# Patient Record
Sex: Female | Born: 1935 | Race: White | Hispanic: No | Smoking: Never smoker
Health system: Southern US, Community
[De-identification: ages and names within clinical notes are randomized; demographics above are authoritative.]

## PROBLEM LIST (undated history)

## (undated) DIAGNOSIS — E079 Disorder of thyroid, unspecified: Secondary | ICD-10-CM

## (undated) DIAGNOSIS — F329 Major depressive disorder, single episode, unspecified: Secondary | ICD-10-CM

## (undated) DIAGNOSIS — I639 Cerebral infarction, unspecified: Secondary | ICD-10-CM

## (undated) DIAGNOSIS — G459 Transient cerebral ischemic attack, unspecified: Secondary | ICD-10-CM

## (undated) DIAGNOSIS — H409 Unspecified glaucoma: Secondary | ICD-10-CM

## (undated) DIAGNOSIS — E785 Hyperlipidemia, unspecified: Secondary | ICD-10-CM

## (undated) DIAGNOSIS — F32A Depression, unspecified: Secondary | ICD-10-CM

## (undated) DIAGNOSIS — I10 Essential (primary) hypertension: Secondary | ICD-10-CM

## (undated) DIAGNOSIS — K6289 Other specified diseases of anus and rectum: Secondary | ICD-10-CM

## (undated) DIAGNOSIS — J449 Chronic obstructive pulmonary disease, unspecified: Secondary | ICD-10-CM

## (undated) DIAGNOSIS — F0390 Unspecified dementia without behavioral disturbance: Secondary | ICD-10-CM

## (undated) DIAGNOSIS — J45909 Unspecified asthma, uncomplicated: Secondary | ICD-10-CM

## (undated) DIAGNOSIS — A499 Bacterial infection, unspecified: Secondary | ICD-10-CM

## (undated) DIAGNOSIS — H6120 Impacted cerumen, unspecified ear: Secondary | ICD-10-CM

## (undated) DIAGNOSIS — M1990 Unspecified osteoarthritis, unspecified site: Secondary | ICD-10-CM

## (undated) DIAGNOSIS — R296 Repeated falls: Secondary | ICD-10-CM

## (undated) DIAGNOSIS — N390 Urinary tract infection, site not specified: Secondary | ICD-10-CM

## (undated) DIAGNOSIS — M8580 Other specified disorders of bone density and structure, unspecified site: Secondary | ICD-10-CM

## (undated) DIAGNOSIS — K219 Gastro-esophageal reflux disease without esophagitis: Secondary | ICD-10-CM

## (undated) DIAGNOSIS — Z66 Do not resuscitate: Secondary | ICD-10-CM

## (undated) HISTORY — PX: KNEE SURGERY: SHX244

## (undated) HISTORY — PX: APPENDECTOMY: SHX54

## (undated) HISTORY — PX: BLADDER SURGERY: SHX569

## (undated) HISTORY — PX: NM ESOPHAGEAL REFLUX: HXRAD613

## (undated) HISTORY — PX: ABDOMINAL HYSTERECTOMY: SHX81

## (undated) HISTORY — PX: BACK SURGERY: SHX140

---

## 1998-03-15 ENCOUNTER — Other Ambulatory Visit: Admission: RE | Admit: 1998-03-15 | Discharge: 1998-03-15 | Payer: Self-pay | Admitting: *Deleted

## 1999-04-22 ENCOUNTER — Encounter: Admission: RE | Admit: 1999-04-22 | Discharge: 1999-04-22 | Payer: Self-pay | Admitting: *Deleted

## 2005-04-18 ENCOUNTER — Encounter: Admission: RE | Admit: 2005-04-18 | Discharge: 2005-04-18 | Payer: Self-pay | Admitting: Internal Medicine

## 2008-03-02 ENCOUNTER — Inpatient Hospital Stay (HOSPITAL_COMMUNITY): Admission: RE | Admit: 2008-03-02 | Discharge: 2008-03-04 | Payer: Self-pay | Admitting: Neurosurgery

## 2010-05-23 LAB — BASIC METABOLIC PANEL
BUN: 26 mg/dL — ABNORMAL HIGH (ref 6–23)
CO2: 30 mEq/L (ref 19–32)
Calcium: 10.7 mg/dL — ABNORMAL HIGH (ref 8.4–10.5)
Chloride: 101 mEq/L (ref 96–112)
Creatinine, Ser: 1.03 mg/dL (ref 0.4–1.2)
GFR calc Af Amer: >60 mL/min (ref >60)
GFR calc non Af Amer: 51 mL/min — ABNORMAL LOW (ref >60)
Glucose, Bld: 104 mg/dL — ABNORMAL HIGH (ref 70–99)
Potassium: 4.6 mEq/L (ref 3.5–5.1)
Sodium: 139 mEq/L (ref 135–145)

## 2010-05-23 LAB — TYPE AND SCREEN
ABO/RH(D): A POS
Antibody Screen: NEGATIVE

## 2010-05-23 LAB — CBC
MCHC: 32.7 g/dL (ref 30.0–36.0)
Platelets: 310 10*3/uL (ref 150–400)
RBC: 4.39 MIL/uL (ref 3.87–5.11)
RDW: 15.9 % — ABNORMAL HIGH (ref 11.5–15.5)

## 2010-06-21 NOTE — Discharge Summary (Signed)
Lynn Fischer, Lynn Fischer             ACCOUNT NO.:  [id number]   MEDICAL RECORD NO.:  [id number]          PATIENT TYPE:  INP   LOCATION:  3007                         FACILITY:  [facility]   PHYSICIAN:  Reinaldo Meeker, M.D. DATE OF BIRTH:  1918-08-13   DATE OF ADMISSION:  03/02/2008  DATE OF DISCHARGE:  03/04/2008                               DISCHARGE SUMMARY   PRIMARY DIAGNOSIS:  Spondylolisthesis and stenosis with synovial cyst at  L4-5, left.   PRIMARY OPERATIVE PROCEDURE:  Left L4-5 transforaminal lumbar interbody  fusion with instrumentation.   HISTORY:  [name] is an 77 year old female with back and left lower  extremity pain.  She tried conservative without improvement.  An MRI was  done which showed grade 1 spondylolisthesis with stenosis and synovial  cyst at L4-5 on the left and she was brought to the hospital at this  time for surgical intervention.  On 01/25, the patient was taken to  operating room where she underwent the above-mentioned fusion.  She  tolerated the procedure well with complete relief of her leg pain.  Over  the next two days, she was able to advance her diet and activity without  difficulty.  By 01/27, she was up ambulating well.  Her wound looked  good.  She had no leg pain at this time and it was felt that she could  be discharged home.  Discharge medications included her routine  preoperative medications along with Dilaudid for pain.  Her condition  was markedly improved versus admission.           ______________________________  Reinaldo Meeker, M.D.     ROK/MEDQ  D:  03/04/2008  T:  03/04/2008  Job:  [id number]

## 2010-06-21 NOTE — Op Note (Signed)
NAMENITHILA, SUMNERS             ACCOUNT NO.:  [id number]   MEDICAL RECORD NO.:  [id number]          PATIENT TYPE:  INP   LOCATION:  3007                         FACILITY:  [facility]   PHYSICIAN:  Reinaldo Meeker, M.D. DATE OF BIRTH:  1918-09-10   DATE OF PROCEDURE:  03/02/2008  DATE OF DISCHARGE:                               OPERATIVE REPORT   PREOPERATIVE DIAGNOSIS:  Spondylolisthesis and stenosis with synovial  cyst L4-5 left.   POSTOPERATIVE DIAGNOSIS:  Spondylolisthesis and stenosis with synovial  cyst L4-5 left.   PROCEDURE:  Left L4-5 transverse lumbar interbody fusion followed with a  PEEK interbody cage, followed by L4-5 nonsegmental instrumentation with  right L4-5 transfacet screw and spinous process plate, followed by L4-5  posterolateral fusion.   SURGEON:  Reinaldo Meeker, MD   ASSISTANT:  Kathaleen Maser. Pool, MD   PROCEDURE IN DETAIL:  After being placed in the prone position, the  patient's back was prepped and draped in the usual sterile fashion.  Localizing fluoroscopy was used prior to incision to identify the  appropriate level.  Midline incision was made above the spinous process  of L4-L5.  Using Bovie cutting current, the incision was carried down  the spinous processes.  A subperiosteal dissection was then carried out  bilaterally on the spinous processes of the lamina facet joint.  Self-  retaining retractor was placed for exposure.  X-rays showed approach to  the appropriate level.  On the patient's left side, generous laminotomy  was performed by removing the inferior three-quarters of the L4 lamina,  medial three-quarters of facet joint, and the superior one-third of the  L5 lamina.  Residual bone ligamentum flavum removed in a piecemeal  fashion.  L4 and L5 nerve roots were both identified more so than needed  for transverse lumbar interbody fusion.  At this time, a disk at L4-5  was incised and thoroughly cleaned out with pituitary rongeurs and  curettes.  The disk was then prepared for posterior interbody fusion  with a variety of decorticated instruments.  This was distracted up to a  10 mm size and this was felt to be a good fit.  Prior to placing the  PEEK interbody cage filled with Osteocel Plus autologous bone, a mixture  of Osteocel Plus and autologous bone were placed free from the  interspace to help with the interbody fusion.  The cage was then  impacted and then rotated crossways without difficulty and was found to  be in excellent position in AP and lateral fluoroscopy.  At this time, a  transfacet screw was placed on the right at L4-5.  Drill hole entry  point was carried out.  Drill over the guidewire was passed with the  guide through the inferior facet of L4 into the pedicle of L5.  This was  found in AP and lateral fluoroscopy.  The intraoperative EMG monitoring  also confirmed no evidence of breech of the pedicle.  Tapping was then  carried out prior to placing of 35 mm screw which was found to be in  excellent position on AP and lateral fluoroscopy.  Interspinous ligament  was then removed and a 45-mm interspinous plate was chosen.  Once the  spinous processes were prepared, the plate was compressed under the  spinous process of L4-L5 without difficulty and fluoroscopy showed to be  in excellent position.  Large amounts of irrigation were carried out.  Any bleeding controlled on the left side with bipolar coagulation and  Gelfoam.  High-speed drill was used to decorticate the lamina facet  joint on the patient's right side at L4-5 and a mixture of autologous  bone and Osteocel Plus were placed.  Hemovac drain was then left in the  epidural space and brought through a separate stab wound incision.  Wound was then closed in multiple layers of Vicryl in the muscle fascia,  subcutaneous and subcuticular tissues and staples were placed on the  skin.  Sterile dressing was then applied.  The patient was extubated and   taken to recovery room in stable condition.           ______________________________  Reinaldo Meeker, M.D.     ROK/MEDQ  D:  03/02/2008  T:  03/03/2008  Job:  [id number]

## 2011-11-02 ENCOUNTER — Encounter (HOSPITAL_BASED_OUTPATIENT_CLINIC_OR_DEPARTMENT_OTHER): Payer: Self-pay | Admitting: *Deleted

## 2011-11-02 ENCOUNTER — Other Ambulatory Visit: Payer: Self-pay

## 2011-11-02 ENCOUNTER — Emergency Department (HOSPITAL_BASED_OUTPATIENT_CLINIC_OR_DEPARTMENT_OTHER): Payer: No Typology Code available for payment source

## 2011-11-02 ENCOUNTER — Emergency Department (HOSPITAL_BASED_OUTPATIENT_CLINIC_OR_DEPARTMENT_OTHER)
Admission: EM | Admit: 2011-11-02 | Discharge: 2011-11-02 | Disposition: A | Discharge status: Home / Self Care | Payer: No Typology Code available for payment source | Attending: Emergency Medicine | Admitting: Emergency Medicine

## 2011-11-02 DIAGNOSIS — Y93H2 Activity, gardening and landscaping: Secondary | ICD-10-CM | POA: Insufficient documentation

## 2011-11-02 DIAGNOSIS — Y92009 Unspecified place in unspecified non-institutional (private) residence as the place of occurrence of the external cause: Secondary | ICD-10-CM | POA: Insufficient documentation

## 2011-11-02 DIAGNOSIS — I10 Essential (primary) hypertension: Secondary | ICD-10-CM | POA: Insufficient documentation

## 2011-11-02 DIAGNOSIS — R42 Dizziness and giddiness: Secondary | ICD-10-CM | POA: Insufficient documentation

## 2011-11-02 DIAGNOSIS — W19XXXA Unspecified fall, initial encounter: Secondary | ICD-10-CM | POA: Insufficient documentation

## 2011-11-02 DIAGNOSIS — S0990XA Unspecified injury of head, initial encounter: Secondary | ICD-10-CM | POA: Insufficient documentation

## 2011-11-02 HISTORY — DX: Essential (primary) hypertension: I10

## 2011-11-02 HISTORY — DX: Unspecified asthma, uncomplicated: J45.909

## 2011-11-02 LAB — TROPONIN I: Troponin I: <0.3 ng/mL (ref <0.30)

## 2011-11-02 LAB — CBC
MCH: 29.7 pg (ref 26.0–34.0)
MCHC: 33.2 g/dL (ref 30.0–36.0)
MCV: 89.6 fL (ref 78.0–100.0)
Platelets: 265 10*3/uL (ref 150–400)
RDW: 14.3 % (ref 11.5–15.5)
WBC: 7.3 10*3/uL (ref 4.0–10.5)

## 2011-11-02 LAB — BASIC METABOLIC PANEL
Calcium: 10.9 mg/dL — ABNORMAL HIGH (ref 8.4–10.5)
Creatinine, Ser: 0.9 mg/dL (ref 0.50–1.10)
GFR calc Af Amer: 63 mL/min — ABNORMAL LOW (ref >90)
GFR calc non Af Amer: 54 mL/min — ABNORMAL LOW (ref >90)

## 2011-11-02 MED ORDER — SODIUM CHLORIDE 0.9 % IV BOLUS (SEPSIS): 500.0000 mL | Freq: Once | INTRAVENOUS | Status: DC

## 2011-11-02 NOTE — ED Provider Notes (Signed)
History     CSN: [id number]  Arrival date & time 11/02/11  1226   First MD Initiated Contact with Patient 11/02/11 1226      Chief Complaint  Patient presents with  . Fall  . Head Injury  . Dizziness    (Consider location/radiation/quality/duration/timing/severity/associated sxs/prior treatment) HPI Lynn Fischer is a 78 y.o. female history of hypertension asthma presenting with a fall. She is awake alert and oriented x4. She says she was out on her porch watering her plants, when she bent over she got a little bit dizzy and fell backwards. She remembers the entire event, hit her head denies loss of consciousness. Initially she refused transport but later agreed. At this point she denies any headache, neck pain, chest pain, shortness of breath, nausea vomiting, fevers or chills.  A focal neurologic complaints. No traumatic complaints. She has been otherwise in good health. She walks with a walker, she has 2 walkers in the house to use her various locations. In fact the patient had walked down the street to clinic to check on appointment early this morning and felt just fine. She says occasionally she does get dizzy when she bends over.  Past Medical History  Diagnosis Date  . Hypertension   . Asthma     History reviewed. No pertinent past surgical history.  History reviewed. No pertinent family history.  History  Substance Use Topics  . Smoking status: Never Smoker   . Smokeless tobacco: Not on file  . Alcohol Use: No    OB History    Grav Para Term Preterm Abortions TAB SAB Ect Mult Living                  Review of Systems At least 10pt or greater review of systems completed and are negative except where specified in the HPI.  Allergies  Review of patient's allergies indicates no known allergies.  Home Medications  No current outpatient prescriptions on file.  There were no vitals taken for this visit.  Physical Exam Nursing notes reviewed.  Electronic  medical record reviewed. VITAL SIGNS:   Filed Vitals:   11/02/11 1311 11/02/11 1312 11/02/11 1313 11/02/11 1533  BP: 169/92 169/103 176/98 161/81  Pulse: 93 102 99 91  Temp:    98.4 F (36.9 C)  TempSrc:    Oral  Resp:    18  SpO2:    97%   CONSTITUTIONAL: Awake, oriented, appears non-toxic HENT: Right parietal scalp hematoma, no laceration, normocephalic, oral mucosa pink and moist, airway patent. Nares patent without drainage. External ears normal. EYES: Conjunctiva clear, EOMI, PERRLA NECK: Trachea midline, non-tender, supple CARDIOVASCULAR: Normal heart rate, Normal rhythm, No murmurs, rubs, gallops PULMONARY/CHEST: Clear to auscultation, no rhonchi, wheezes, or rales. Symmetrical breath sounds. Non-tender. ABDOMINAL: Non-distended, obese, soft, non-tender - no rebound or guarding.  BS normal. NEUROLOGIC: Non-focal, moving all four extremities, no gross sensory or motor deficits. EXTREMITIES: No clubbing, cyanosis, or edema SKIN: Warm, Dry, No erythema, No rash  ED Course  Procedures (including critical care time)  Labs Reviewed  BASIC METABOLIC PANEL - Abnormal; Notable for the following:    BUN 31 (*)     Calcium 10.9 (*)     GFR calc non Af Amer 54 (*)     GFR calc Af Amer 63 (*)     All other components within normal limits  CBC  TROPONIN I   No results found.   1. Fall     MDM  Lynn Fischer is a 78 y.o. female presenting with fall. He states in the patient's presentation, she's completely normal, at baseline, no neurologic deficits, no complaints of pain, no chest pain. No prodromal events including dizziness chest pain or shortness of breath simply get dizzy when she bent over to water some plants. Patient is laughing at this time and very pleasant to interact with.  We'll perform some screening labs due to the patient's age, EKG, CT of the head and troponin.  Laboratory workup is negative - show some chronic renal insufficiency however she is a GFR of 54  and 91 it's pretty good and seems to be at her, EKG shows normal sinus rhythm without ST or T wave abnormalities suggestive of ischemia or infarction-no arrhythmia or atrial fibrillation, CT of the head shows a right parietal scalp hematoma consistent with physical exam without any acute intracranial hemorrhage or abnormality.  Discussed findings with the patient of my physical exam and workup, she would like to go home.  Patient does have close followup will be seen within the next 2 days by primary care physician she also has assistance with her apartment. Patient also has a walking assist device is at home.  I explained the diagnosis and have given explicit precautions to return to the ER including return of dizziness, persistent dizziness, focal neurologic deficits, chest pain, shortness of breath or any other new or worsening symptoms. The patient understands and accepts the medical plan as it's been dictated and I have answered their questions. Discharge instructions concerning home care and prescriptions have been given.  The patient is STABLE and is discharged to home in good condition.        Jones Skene, MD 11/06/11 1451

## 2011-11-02 NOTE — ED Notes (Signed)
Pt to room 10 by PTAR. Pt is awake and alert, states she was watering her plants this am and became dizzy. ems states pt initially refused transport, but later agreed due to ongoing dizziness. Denies any ha, moe + x 4 ext, grips are = and strong, speech is clear, smile is symmetrical. No ekg performed by ems, ekg performed by tech while pt being triaged.

## 2012-05-13 ENCOUNTER — Emergency Department (HOSPITAL_BASED_OUTPATIENT_CLINIC_OR_DEPARTMENT_OTHER)
Admission: EM | Admit: 2012-05-13 | Discharge: 2012-05-13 | Disposition: A | Discharge status: Other Acute Inpatient Hospital | Payer: No Typology Code available for payment source | Attending: Emergency Medicine | Admitting: Emergency Medicine

## 2012-05-13 ENCOUNTER — Emergency Department (HOSPITAL_BASED_OUTPATIENT_CLINIC_OR_DEPARTMENT_OTHER): Payer: No Typology Code available for payment source

## 2012-05-13 ENCOUNTER — Encounter (HOSPITAL_BASED_OUTPATIENT_CLINIC_OR_DEPARTMENT_OTHER): Payer: Self-pay | Admitting: *Deleted

## 2012-05-13 DIAGNOSIS — Z7982 Long term (current) use of aspirin: Secondary | ICD-10-CM | POA: Insufficient documentation

## 2012-05-13 DIAGNOSIS — R079 Chest pain, unspecified: Secondary | ICD-10-CM | POA: Insufficient documentation

## 2012-05-13 DIAGNOSIS — Z8719 Personal history of other diseases of the digestive system: Secondary | ICD-10-CM | POA: Insufficient documentation

## 2012-05-13 DIAGNOSIS — M1990 Unspecified osteoarthritis, unspecified site: Secondary | ICD-10-CM | POA: Insufficient documentation

## 2012-05-13 DIAGNOSIS — R5381 Other malaise: Secondary | ICD-10-CM | POA: Insufficient documentation

## 2012-05-13 DIAGNOSIS — R05 Cough: Secondary | ICD-10-CM | POA: Insufficient documentation

## 2012-05-13 DIAGNOSIS — Z79899 Other long term (current) drug therapy: Secondary | ICD-10-CM | POA: Insufficient documentation

## 2012-05-13 DIAGNOSIS — J449 Chronic obstructive pulmonary disease, unspecified: Secondary | ICD-10-CM

## 2012-05-13 DIAGNOSIS — J441 Chronic obstructive pulmonary disease with (acute) exacerbation: Secondary | ICD-10-CM | POA: Insufficient documentation

## 2012-05-13 DIAGNOSIS — F329 Major depressive disorder, single episode, unspecified: Secondary | ICD-10-CM | POA: Insufficient documentation

## 2012-05-13 DIAGNOSIS — F3289 Other specified depressive episodes: Secondary | ICD-10-CM | POA: Insufficient documentation

## 2012-05-13 DIAGNOSIS — R112 Nausea with vomiting, unspecified: Secondary | ICD-10-CM | POA: Insufficient documentation

## 2012-05-13 DIAGNOSIS — M899 Disorder of bone, unspecified: Secondary | ICD-10-CM | POA: Insufficient documentation

## 2012-05-13 DIAGNOSIS — J45901 Unspecified asthma with (acute) exacerbation: Secondary | ICD-10-CM | POA: Insufficient documentation

## 2012-05-13 DIAGNOSIS — R059 Cough, unspecified: Secondary | ICD-10-CM | POA: Insufficient documentation

## 2012-05-13 DIAGNOSIS — I10 Essential (primary) hypertension: Secondary | ICD-10-CM | POA: Insufficient documentation

## 2012-05-13 DIAGNOSIS — N390 Urinary tract infection, site not specified: Secondary | ICD-10-CM | POA: Insufficient documentation

## 2012-05-13 HISTORY — DX: Impacted cerumen, unspecified ear: H61.20

## 2012-05-13 HISTORY — DX: Unspecified osteoarthritis, unspecified site: M19.90

## 2012-05-13 HISTORY — DX: Other specified diseases of anus and rectum: K62.89

## 2012-05-13 HISTORY — DX: Major depressive disorder, single episode, unspecified: F32.9

## 2012-05-13 HISTORY — DX: Other specified disorders of bone density and structure, unspecified site: M85.80

## 2012-05-13 HISTORY — DX: Essential (primary) hypertension: I10

## 2012-05-13 HISTORY — DX: Depression, unspecified: F32.A

## 2012-05-13 LAB — CBC WITH DIFFERENTIAL/PLATELET
Basophils Absolute: 0 10*3/uL (ref 0.0–0.1)
Basophils Relative: 0 % (ref 0–1)
Eosinophils Absolute: 0.1 10*3/uL (ref 0.0–0.7)
MCH: 29.8 pg (ref 26.0–34.0)
MCHC: 32.7 g/dL (ref 30.0–36.0)
Monocytes Absolute: 0.6 10*3/uL (ref 0.1–1.0)
Neutro Abs: 7.3 10*3/uL (ref 1.7–7.7)
Neutrophils Relative %: 78 % — ABNORMAL HIGH (ref 43–77)
RDW: 15.1 % (ref 11.5–15.5)

## 2012-05-13 LAB — COMPREHENSIVE METABOLIC PANEL
AST: 17 U/L (ref 0–37)
Albumin: 4 g/dL (ref 3.5–5.2)
Chloride: 104 mEq/L (ref 96–112)
Creatinine, Ser: 1 mg/dL (ref 0.50–1.10)
Potassium: 4.1 mEq/L (ref 3.5–5.1)
Total Bilirubin: 0.7 mg/dL (ref 0.3–1.2)
Total Protein: 7.5 g/dL (ref 6.0–8.3)

## 2012-05-13 LAB — URINE MICROSCOPIC-ADD ON

## 2012-05-13 LAB — URINALYSIS, ROUTINE W REFLEX MICROSCOPIC
Glucose, UA: NEGATIVE mg/dL
Hgb urine dipstick: NEGATIVE
Protein, ur: NEGATIVE mg/dL
pH: 6.5 (ref 5.0–8.0)

## 2012-05-13 MED ORDER — DEXTROSE 5 % IV SOLN
1.0000 g | Freq: Once | INTRAVENOUS | Status: AC
  Administered 2012-05-13: 1 g via INTRAVENOUS
  Filled 2012-05-13: qty 10

## 2012-05-13 MED ORDER — SODIUM CHLORIDE 0.9 % IV BOLUS (SEPSIS)
500.0000 mL | Freq: Once | INTRAVENOUS | Status: AC
  Administered 2012-05-13: 500 mL via INTRAVENOUS

## 2012-05-13 MED ORDER — ALBUTEROL SULFATE (5 MG/ML) 0.5% IN NEBU
INHALATION_SOLUTION | RESPIRATORY_TRACT | Status: AC
  Administered 2012-05-13: 5 mg
  Filled 2012-05-13: qty 1

## 2012-05-13 MED ORDER — ONDANSETRON HCL 4 MG/2ML IJ SOLN
4.0000 mg | Freq: Once | INTRAMUSCULAR | Status: AC
  Administered 2012-05-13: 4 mg via INTRAVENOUS
  Filled 2012-05-13: qty 2

## 2012-05-13 MED ORDER — PREDNISONE 50 MG PO TABS
60.0000 mg | ORAL_TABLET | Freq: Once | ORAL | Status: AC
  Administered 2012-05-13: 60 mg via ORAL
  Filled 2012-05-13: qty 1

## 2012-05-13 NOTE — Progress Notes (Signed)
Patient was assisted by RN and myself and ambulated around the ED department. Sats started at 95% and pulse rate of 95bpm. The HR high was 114bpm. The sats stayed between 88-93% most of the time. When the patient began to talk the sats dropped as low as 79%. For most of the time talking they stayed between 81-84%. Upon return to the room the sats rose quickly back to >95%.

## 2012-05-13 NOTE — ED Notes (Signed)
Called [facility] to give report and they will call back

## 2012-05-13 NOTE — ED Notes (Signed)
Patient sent to ER by NP at [facility], c/o N/V, was given albuterol at two AM & this morning for SOB

## 2012-05-13 NOTE — ED Provider Notes (Signed)
History     CSN: [id number]  Arrival date & time 05/13/12  1126   First MD Initiated Contact with Patient 05/13/12 1209      Chief Complaint  Patient presents with  . Nausea  . Shortness of Breath    (Consider location/radiation/quality/duration/timing/severity/associated sxs/prior treatment) Patient is a 79 y.o. female presenting with shortness of breath. The history is provided by the patient.  Shortness of Breath Severity:  Moderate Associated symptoms: chest pain and vomiting   Associated symptoms: no abdominal pain, no headaches and no rash    patient's had shortness of breath the last couple days. No fevers. She's had a cough with some nausea and vomiting. The cough has been nonproductive. Patient initially denies chest pain, but later states that she does have this pain in the middle of her chest. No fevers. No clear sick contacts. She has a history of asthma, but states it is not normal it lasts as long. No relief with her nebulizer at home. No swelling or legs. No diarrhea. States feels weak all over.  Past Medical History  Diagnosis Date  . Hypertension   . Asthma   . Depression   . Osteoarthritis   . Osteopenia   . Proctitis   . Benign essential hypertension   . Cerumen impaction     Past Surgical History  Procedure Laterality Date  . Appendectomy    . Back surgery    . Knee surgery      bilateral  . Nm esophageal reflux      No family history on file.  History  Substance Use Topics  . Smoking status: Never Smoker   . Smokeless tobacco: Not on file  . Alcohol Use: No    OB History   Grav Para Term Preterm Abortions TAB SAB Ect Mult Living                  Review of Systems  Constitutional: Positive for fatigue. Negative for activity change and appetite change.  HENT: Negative for neck stiffness.   Eyes: Negative for pain.  Respiratory: Positive for shortness of breath. Negative for chest tightness.   Cardiovascular: Positive for chest pain.  Negative for leg swelling.  Gastrointestinal: Positive for nausea and vomiting. Negative for abdominal pain and diarrhea.  Genitourinary: Negative for flank pain.  Musculoskeletal: Negative for back pain.  Skin: Negative for rash.  Neurological: Positive for weakness. Negative for numbness and headaches.  Psychiatric/Behavioral: Negative for behavioral problems.    Allergies  Review of patient's allergies indicates no known allergies.  Home Medications   Current Outpatient Rx  Name  Route  Sig  Dispense  Refill  . Cholecalciferol (VITAMIN D-3) 1000 UNITS CAPS   Oral   Take by mouth.         Marland Kitchen albuterol (PROVENTIL HFA;VENTOLIN HFA) 108 (90 BASE) MCG/ACT inhaler   Inhalation   Inhale 2 puffs into the lungs every 6 (six) hours as needed.         Marland Kitchen aspirin 81 MG tablet   Oral   Take 81 mg by mouth daily.         . beclomethasone (QVAR) 80 MCG/ACT inhaler   Inhalation   Inhale 2 puffs into the lungs 2 (two) times daily.         . brimonidine (ALPHAGAN) 0.2 % ophthalmic solution      1 drop 3 (three) times daily.         . Calcium Carbonate-Vitamin D (CALTRATE 600+D)  600-400 MG-UNIT per chew tablet   Oral   Chew 1 tablet by mouth daily.         . cholestyramine (QUESTRAN) 4 GM/DOSE powder   Oral   Take 1 g by mouth 2 (two) times daily with a meal.         . FLUoxetine (PROZAC) 10 MG capsule   Oral   Take 10 mg by mouth daily.         Marland Kitchen HYDROcodone-acetaminophen (VICODIN) 5-500 MG per tablet   Oral   Take 1 tablet by mouth every 6 (six) hours as needed.         . latanoprost (XALATAN) 0.005 % ophthalmic solution   Both Eyes   Place 1 drop into both eyes daily.         . mometasone (NASONEX) 50 MCG/ACT nasal spray   Nasal   Place 2 sprays into the nose daily.         . mometasone-formoterol (DULERA) 100-5 MCG/ACT AERO   Inhalation   Inhale 2 puffs into the lungs 2 (two) times daily.         . montelukast (SINGULAIR) 10 MG tablet    Oral   Take 10 mg by mouth at bedtime.         . nabumetone (RELAFEN) 500 MG tablet   Oral   Take 500 mg by mouth 2 (two) times daily.         . RABEprazole (ACIPHEX) 20 MG tablet   Oral   Take 20 mg by mouth daily.         . valsartan-hydrochlorothiazide (DIOVAN-HCT) 320-12.5 MG per tablet   Oral   Take 1 tablet by mouth daily.           BP 127/74  Pulse 83  Temp(Src) 98.2 F (36.8 C) (Oral)  Resp 16  Ht 5\' 4"  (1.626 m)  Wt 153 lb (69.4 kg)  BMI 26.25 kg/m2  SpO2 94%  Physical Exam  Nursing note and vitals reviewed. Constitutional: She is oriented to person, place, and time. She appears well-developed and well-nourished.  HENT:  Head: Normocephalic and atraumatic.  Eyes: EOM are normal. Pupils are equal, round, and reactive to light.  Neck: Normal range of motion. Neck supple.  Cardiovascular: Normal rate, regular rhythm and normal heart sounds.   No murmur heard. Pulmonary/Chest: Effort normal and breath sounds normal. No respiratory distress. She has no wheezes. She has no rales.  Abdominal: Soft. Bowel sounds are normal. She exhibits no distension. There is no tenderness. There is no rebound and no guarding.  Musculoskeletal: Normal range of motion.  Neurological: She is alert and oriented to person, place, and time. No cranial nerve deficit.  Skin: Skin is warm and dry.  Psychiatric: She has a normal mood and affect. Her speech is normal.    ED Course  Procedures (including critical care time)  Labs Reviewed  CBC WITH DIFFERENTIAL - Abnormal; Notable for the following:    Neutrophils Relative 78 (*)    All other components within normal limits  COMPREHENSIVE METABOLIC PANEL - Abnormal; Notable for the following:    Glucose, Bld 107 (*)    BUN 26 (*)    Calcium 11.2 (*)    Alkaline Phosphatase 125 (*)    GFR calc non Af Amer 47 (*)    GFR calc Af Amer 55 (*)    All other components within normal limits  URINALYSIS, ROUTINE W REFLEX MICROSCOPIC -  Abnormal; Notable for  the following:    APPearance CLOUDY (*)    Nitrite POSITIVE (*)    Leukocytes, UA MODERATE (*)    All other components within normal limits  URINE MICROSCOPIC-ADD ON - Abnormal; Notable for the following:    Bacteria, UA MANY (*)    All other components within normal limits  URINE CULTURE  TROPONIN I   Dg Chest 2 View  05/13/2012  *RADIOLOGY REPORT*  Clinical Data: Shortness of breath, nausea and vomiting.  CHEST - 2 VIEW  Comparison: 02/27/2008  Findings: Underlying chronic lung disease/COPD present.  No edema, infiltrate, nodule, pneumothorax or pleural fluid is identified. Heart size is at the upper limits of normal.  Bony thorax shows osteopenia and degenerative changes of the thoracic spine which appears stable.  IMPRESSION: COPD.  No active disease.   Original Report Authenticated By: Irish Lack, M.D.      1. COPD (chronic obstructive pulmonary disease)   2. UTI (urinary tract infection)       MDM  Patient with shortness of breath. X-ray reassuring as is EKG, but has UTI. She is hypoxic with ambulation. Doubt pulmonary embolus and this time. She's a history of COPD. She'll be admitted to [facility]. Discussed with [facility] hospitalist.        Juliet Rude. Rubin Payor, MD 05/13/12 1530

## 2012-05-17 LAB — URINE CULTURE: Colony Count: >=100000

## 2012-05-18 ENCOUNTER — Telehealth (HOSPITAL_COMMUNITY): Payer: Self-pay | Admitting: Emergency Medicine

## 2012-05-18 NOTE — ED Notes (Signed)
Patient has +Urine culture. °

## 2012-05-18 NOTE — ED Notes (Signed)
+   Urine Chart sent to EDP office for review. 

## 2012-05-19 ENCOUNTER — Telehealth (HOSPITAL_COMMUNITY): Payer: Self-pay | Admitting: Emergency Medicine

## 2012-05-19 NOTE — ED Notes (Signed)
Chart returned from EDP office. Per Roxy Horseman PA-C, start Keflex 500 mg QID x 7 days. #28. After reviewing chart, noted that patient was admitted to [facility]

## 2013-02-10 ENCOUNTER — Emergency Department (HOSPITAL_BASED_OUTPATIENT_CLINIC_OR_DEPARTMENT_OTHER): Payer: No Typology Code available for payment source

## 2013-02-10 ENCOUNTER — Encounter (HOSPITAL_BASED_OUTPATIENT_CLINIC_OR_DEPARTMENT_OTHER): Payer: Self-pay | Admitting: Emergency Medicine

## 2013-02-10 ENCOUNTER — Emergency Department (HOSPITAL_BASED_OUTPATIENT_CLINIC_OR_DEPARTMENT_OTHER)
Admission: EM | Admit: 2013-02-10 | Discharge: 2013-02-11 | Disposition: A | Discharge status: Other Acute Inpatient Hospital | Payer: No Typology Code available for payment source | Attending: Emergency Medicine | Admitting: Emergency Medicine

## 2013-02-10 DIAGNOSIS — I10 Essential (primary) hypertension: Secondary | ICD-10-CM | POA: Insufficient documentation

## 2013-02-10 DIAGNOSIS — F329 Major depressive disorder, single episode, unspecified: Secondary | ICD-10-CM | POA: Diagnosis not present

## 2013-02-10 DIAGNOSIS — R0902 Hypoxemia: Secondary | ICD-10-CM | POA: Insufficient documentation

## 2013-02-10 DIAGNOSIS — IMO0002 Reserved for concepts with insufficient information to code with codable children: Secondary | ICD-10-CM | POA: Insufficient documentation

## 2013-02-10 DIAGNOSIS — Z79899 Other long term (current) drug therapy: Secondary | ICD-10-CM | POA: Insufficient documentation

## 2013-02-10 DIAGNOSIS — Z87448 Personal history of other diseases of urinary system: Secondary | ICD-10-CM | POA: Insufficient documentation

## 2013-02-10 DIAGNOSIS — F3289 Other specified depressive episodes: Secondary | ICD-10-CM | POA: Insufficient documentation

## 2013-02-10 DIAGNOSIS — R0602 Shortness of breath: Secondary | ICD-10-CM | POA: Diagnosis present

## 2013-02-10 DIAGNOSIS — M1990 Unspecified osteoarthritis, unspecified site: Secondary | ICD-10-CM | POA: Insufficient documentation

## 2013-02-10 DIAGNOSIS — Z7982 Long term (current) use of aspirin: Secondary | ICD-10-CM | POA: Insufficient documentation

## 2013-02-10 DIAGNOSIS — J45901 Unspecified asthma with (acute) exacerbation: Secondary | ICD-10-CM | POA: Diagnosis not present

## 2013-02-10 DIAGNOSIS — Z8669 Personal history of other diseases of the nervous system and sense organs: Secondary | ICD-10-CM | POA: Insufficient documentation

## 2013-02-10 DIAGNOSIS — R197 Diarrhea, unspecified: Secondary | ICD-10-CM | POA: Diagnosis not present

## 2013-02-10 DIAGNOSIS — Z9981 Dependence on supplemental oxygen: Secondary | ICD-10-CM | POA: Diagnosis not present

## 2013-02-10 LAB — CBC WITH DIFFERENTIAL/PLATELET
Basophils Absolute: 0 10*3/uL (ref 0.0–0.1)
Basophils Relative: 0 % (ref 0–1)
EOS PCT: 2 % (ref 0–5)
Eosinophils Absolute: 0.1 10*3/uL (ref 0.0–0.7)
HCT: 43.8 % (ref 36.0–46.0)
Hemoglobin: 14.1 g/dL (ref 12.0–15.0)
LYMPHS ABS: 1.6 10*3/uL (ref 0.7–4.0)
LYMPHS PCT: 33 % (ref 12–46)
MCH: 29.7 pg (ref 26.0–34.0)
MCHC: 32.2 g/dL (ref 30.0–36.0)
MCV: 92.2 fL (ref 78.0–100.0)
Monocytes Absolute: 0.7 10*3/uL (ref 0.1–1.0)
Monocytes Relative: 16 % — ABNORMAL HIGH (ref 3–12)
NEUTROS ABS: 2.3 10*3/uL (ref 1.7–7.7)
Neutrophils Relative %: 49 % (ref 43–77)
PLATELETS: 211 10*3/uL (ref 150–400)
RBC: 4.75 MIL/uL (ref 3.87–5.11)
RDW: 14.4 % (ref 11.5–15.5)
WBC: 4.7 10*3/uL (ref 4.0–10.5)

## 2013-02-10 LAB — BASIC METABOLIC PANEL
BUN: 39 mg/dL — ABNORMAL HIGH (ref 6–23)
CHLORIDE: 99 meq/L (ref 96–112)
CO2: 25 meq/L (ref 19–32)
Calcium: 10.6 mg/dL — ABNORMAL HIGH (ref 8.4–10.5)
Creatinine, Ser: 1.4 mg/dL — ABNORMAL HIGH (ref 0.50–1.10)
GFR calc Af Amer: 37 mL/min — ABNORMAL LOW (ref >90)
GFR calc non Af Amer: 32 mL/min — ABNORMAL LOW (ref >90)
GLUCOSE: 101 mg/dL — AB (ref 70–99)
POTASSIUM: 4.2 meq/L (ref 3.7–5.3)
SODIUM: 142 meq/L (ref 137–147)

## 2013-02-10 LAB — TROPONIN I

## 2013-02-10 LAB — PRO B NATRIURETIC PEPTIDE: PRO B NATRI PEPTIDE: 289.6 pg/mL (ref 0–450)

## 2013-02-10 MED ORDER — IPRATROPIUM-ALBUTEROL 0.5-2.5 (3) MG/3ML IN SOLN
RESPIRATORY_TRACT | Status: AC
  Filled 2013-02-10: qty 3

## 2013-02-10 MED ORDER — SODIUM CHLORIDE 0.9 % IV BOLUS (SEPSIS)
500.0000 mL | Freq: Once | INTRAVENOUS | Status: AC
  Administered 2013-02-10: 500 mL via INTRAVENOUS

## 2013-02-10 MED ORDER — IPRATROPIUM-ALBUTEROL 0.5-2.5 (3) MG/3ML IN SOLN
RESPIRATORY_TRACT | Status: AC
  Administered 2013-02-10: 3 mL via RESPIRATORY_TRACT
  Filled 2013-02-10: qty 3

## 2013-02-10 MED ORDER — IPRATROPIUM-ALBUTEROL 0.5-2.5 (3) MG/3ML IN SOLN: 3.0000 mL | Freq: Once | RESPIRATORY_TRACT | Status: DC

## 2013-02-10 MED ORDER — PREDNISONE 50 MG PO TABS
60.0000 mg | ORAL_TABLET | Freq: Once | ORAL | Status: AC
  Administered 2013-02-10: 60 mg via ORAL
  Filled 2013-02-10 (×2): qty 1

## 2013-02-10 MED ORDER — IPRATROPIUM-ALBUTEROL 0.5-2.5 (3) MG/3ML IN SOLN: 3.0000 mL | RESPIRATORY_TRACT | Status: DC

## 2013-02-10 MED ORDER — IPRATROPIUM-ALBUTEROL 0.5-2.5 (3) MG/3ML IN SOLN
3.0000 mL | RESPIRATORY_TRACT | Status: DC
  Administered 2013-02-10: 3 mL via RESPIRATORY_TRACT

## 2013-02-10 MED ORDER — IPRATROPIUM-ALBUTEROL 0.5-2.5 (3) MG/3ML IN SOLN
3.0000 mL | RESPIRATORY_TRACT | Status: DC | PRN
  Filled 2013-02-10: qty 3

## 2013-02-10 NOTE — ED Provider Notes (Addendum)
CSN: [id number]     Arrival date & time 02/10/13  1800 History  This chart was scribed for Shanna Cisco, MD by Ardelia Mems, ED Scribe. This patient was seen in room MH08/MH08 and the patient's care was started at 6:47 PM.   Chief Complaint  Patient presents with  . Shortness of Breath    Patient is a 80 y.o. female presenting with shortness of breath. The history is provided by the patient. No language interpreter was used.  Shortness of Breath Severity:  Moderate Onset quality:  Gradual Duration:  10 days Timing:  Intermittent Progression:  Worsening Chronicity:  Recurrent (history of asthma) Relieved by: some relief with at home inhalers. Worsened by:  Nothing tried Ineffective treatments:  None tried Associated symptoms: cough   Associated symptoms: no abdominal pain, no chest pain, no diaphoresis, no fever, no headaches, no neck pain, no sore throat and no vomiting     HPI Comments: Lynn Fischer is a 80 y.o. female with a history of asthma who presents to the Emergency Department complaining of intermittent SOB over the past 10 days. She states that she has had an associated dry cough over the past 10 days. She states that her cough has been worse at night, and has kept her from sleeping. She also states that she had an episode of diarrhea this morning. She states that she has been eating a little less than usual but drinking normally. She states that she is not currently on supplemental oxygen at home. She states that she has been using her prescribed inhalers at home with some relief. She states that she has not been able to use her at home breathing machine at home since it has been malfunctioning/broken. She states that she is a resident at [facility], and she denies any known sick contacts there. She states that she has not taken any antibiotics or steroids recently- although she reports having been on steroids multiple time in the past due to her history of asthma. She  states that she had this season's influenza vaccine, and she does not believe she has had a pneumonia vaccine within the past 5 years. She denies fever, chills, nausea, emesis, leg pain/swelling or any other symptoms. She denies any history of smoking, but states that she was exposed to her husband smoking for 25-30 years.   Past Medical History  Diagnosis Date  . Hypertension   . Asthma   . Depression   . Osteoarthritis   . Osteopenia   . Proctitis   . Benign essential hypertension   . Cerumen impaction    Past Surgical History  Procedure Laterality Date  . Appendectomy    . Back surgery    . Knee surgery      bilateral  . Nm esophageal reflux     No family history on file. History  Substance Use Topics  . Smoking status: Never Smoker   . Smokeless tobacco: Not on file  . Alcohol Use: No   OB History   Grav Para Term Preterm Abortions TAB SAB Ect Mult Living                 Review of Systems  Constitutional: Negative for fever, chills, diaphoresis, activity change, appetite change and fatigue.  HENT: Negative for congestion, facial swelling, rhinorrhea and sore throat.   Eyes: Negative for photophobia and discharge.  Respiratory: Positive for cough and shortness of breath. Negative for chest tightness.   Cardiovascular: Negative for chest  pain, palpitations and leg swelling.  Gastrointestinal: Negative for nausea, vomiting, abdominal pain and diarrhea.  Endocrine: Negative for polydipsia and polyuria.  Genitourinary: Negative for dysuria, frequency, difficulty urinating and pelvic pain.  Musculoskeletal: Negative for arthralgias, back pain, neck pain and neck stiffness.  Skin: Negative for color change and wound.  Allergic/Immunologic: Negative for immunocompromised state.  Neurological: Negative for facial asymmetry, weakness, numbness and headaches.  Hematological: Does not bruise/bleed easily.  Psychiatric/Behavioral: Negative for confusion and agitation.     Allergies  Review of patient's allergies indicates no known allergies.  Home Medications   Current Outpatient Rx  Name  Route  Sig  Dispense  Refill  . metoprolol succinate (TOPROL-XL) 25 MG 24 hr tablet   Oral   Take 25 mg by mouth daily.         . vitamin E (VITAMIN E) 1000 UNIT capsule   Oral   Take 1,000 Units by mouth daily.         Marland Kitchen albuterol (PROVENTIL HFA;VENTOLIN HFA) 108 (90 BASE) MCG/ACT inhaler   Inhalation   Inhale 2 puffs into the lungs every 6 (six) hours as needed.         Marland Kitchen aspirin 81 MG tablet   Oral   Take 81 mg by mouth daily.         . beclomethasone (QVAR) 80 MCG/ACT inhaler   Inhalation   Inhale 2 puffs into the lungs 2 (two) times daily.         . brimonidine (ALPHAGAN) 0.2 % ophthalmic solution      1 drop 3 (three) times daily.         . Calcium Carbonate-Vitamin D (CALTRATE 600+D) 600-400 MG-UNIT per chew tablet   Oral   Chew 1 tablet by mouth daily.         . Cholecalciferol (VITAMIN D-3) 1000 UNITS CAPS   Oral   Take by mouth.         . cholestyramine (QUESTRAN) 4 GM/DOSE powder   Oral   Take 1 g by mouth 2 (two) times daily with a meal.         . FLUoxetine (PROZAC) 10 MG capsule   Oral   Take 10 mg by mouth daily.         Marland Kitchen HYDROcodone-acetaminophen (VICODIN) 5-500 MG per tablet   Oral   Take 1 tablet by mouth every 6 (six) hours as needed.         . latanoprost (XALATAN) 0.005 % ophthalmic solution   Both Eyes   Place 1 drop into both eyes daily.         . mometasone (NASONEX) 50 MCG/ACT nasal spray   Nasal   Place 2 sprays into the nose daily.         . mometasone-formoterol (DULERA) 100-5 MCG/ACT AERO   Inhalation   Inhale 2 puffs into the lungs 2 (two) times daily.         . montelukast (SINGULAIR) 10 MG tablet   Oral   Take 10 mg by mouth at bedtime.         . nabumetone (RELAFEN) 500 MG tablet   Oral   Take 500 mg by mouth 2 (two) times daily.         . RABEprazole  (ACIPHEX) 20 MG tablet   Oral   Take 20 mg by mouth daily.         . valsartan-hydrochlorothiazide (DIOVAN-HCT) 320-12.5 MG per tablet   Oral  Take 1 tablet by mouth daily.          Triage Vitals: BP 113/61  Pulse 102  Temp(Src) 98 F (36.7 C) (Oral)  Resp 24  Ht 5\' 5"  (1.651 m)  Wt 190 lb (86.183 kg)  BMI 31.62 kg/m2  SpO2 94%  Physical Exam  Nursing note and vitals reviewed. Constitutional: She is oriented to person, place, and time. She appears well-developed and well-nourished. No distress.  HENT:  Head: Normocephalic.  Mouth/Throat: Oropharynx is clear and moist.  Eyes: Pupils are equal, round, and reactive to light.  Neck: Neck supple.  Cardiovascular: Normal rate, regular rhythm and normal heart sounds.   Pulmonary/Chest: Effort normal. No respiratory distress. She has wheezes.  Coarse crackles and wheezing throughout.  Abdominal: Soft. She exhibits no distension. There is no tenderness. There is no rebound and no guarding.  Musculoskeletal: She exhibits no edema and no tenderness.  Neurological: She is alert and oriented to person, place, and time.  Skin: Skin is warm and dry.  Psychiatric: She has a normal mood and affect.    ED Course  Procedures (including critical care time)  DIAGNOSTIC STUDIES: Oxygen Saturation is 94% on RA, adequate by my interpretation.    COORDINATION OF CARE: 6:58 PM- Discussed plan to obtain a CXR and diagnostic lab work. Breathing treatments have been ordered. Pt advised of plan for treatment and pt agrees.  Medications  ipratropium-albuterol (DUONEB) 0.5-2.5 (3) MG/3ML nebulizer solution 3 mL (not administered)  sodium chloride 0.9 % bolus 500 mL (0 mLs Intravenous Stopped 02/10/13 2158)  predniSONE (DELTASONE) tablet 60 mg (60 mg Oral Given 02/10/13 2157)   Labs Review Labs Reviewed  CBC WITH DIFFERENTIAL - Abnormal; Notable for the following:    Monocytes Relative 16 (*)    All other components within normal limits   BASIC METABOLIC PANEL - Abnormal; Notable for the following:    Glucose, Bld 101 (*)    BUN 39 (*)    Creatinine, Ser 1.40 (*)    Calcium 10.6 (*)    GFR calc non Af Amer 32 (*)    GFR calc Af Amer 37 (*)    All other components within normal limits  PRO B NATRIURETIC PEPTIDE  TROPONIN I   Imaging Review Dg Chest 2 View  02/10/2013   CLINICAL DATA:  Shortness of breath and cough.  EXAM: CHEST  2 VIEW  COMPARISON:  05/13/2012  FINDINGS: Coarse interstitial lung markings appear to be chronic. Heart size is upper limits of normal but stable. There is some hyperinflation. Bony thorax is intact.  IMPRESSION: Chronic lung changes without acute findings.   Electronically Signed   By: Richarda OverlieAdam  Henn M.D.   On: 02/10/2013 19:54    EKG Interpretation    Date/Time:  Monday February 10 2013 22:01:21 EST Ventricular Rate:  92 PR Interval:  170 QRS Duration: 88 QT Interval:  356 QTC Calculation: 440 R Axis:   19 Text Interpretation:  Sinus rhythm with occasional Premature ventricular complexes Otherwise normal ECG Confirmed by DOCHERTY  MD, MEGAN ([id number]) on 02/10/2013 11:37:50 PM            MDM   1. Asthma exacerbation   2. Hypoxia    Pt is a 80 y.o. female with Pmhx as above who presents with about 10 days of dry cough, intermittent SOB.  She has been using inhalors w/o relief.  On PE, VSS, pt in NAD, she has scattered wheezing and dec breath sounds throughout. Will treat  w/ duoneb.  CXR w/ chronic lung disease.  Will add basic labs.  9:50PM Pt states she feels somewhat better after duoneb x2, but drops to 82% on RA with talking.  Will repeat duoneb, given PO prednisone.  Pt will need to be admitted for asthma exacerbation given new O2 requirement.  Cr 1.4 which is elevated form baseline of 1.  BNP, trop not elevated.  Doubt ACS, CHF, and feel symptoms due to pt's chronic lung disease.   11:32 PM Pt will be admitted to [facility], Dr. Lowell Guitar  I personally performed the services described in this  documentation, which was scribed in my presence. The recorded information has been reviewed and is accurate.     Shanna Cisco, MD 02/10/13 1610  Shanna Cisco, MD 02/10/13 [phone]

## 2013-02-10 NOTE — ED Notes (Signed)
Dry cough and sob since 12/25,

## 2013-02-11 DIAGNOSIS — J45901 Unspecified asthma with (acute) exacerbation: Secondary | ICD-10-CM | POA: Diagnosis not present

## 2013-02-19 DIAGNOSIS — Z66 Do not resuscitate: Secondary | ICD-10-CM

## 2013-02-19 HISTORY — DX: Do not resuscitate: Z66

## 2015-01-03 ENCOUNTER — Encounter (HOSPITAL_BASED_OUTPATIENT_CLINIC_OR_DEPARTMENT_OTHER): Payer: Self-pay | Admitting: *Deleted

## 2015-01-03 ENCOUNTER — Emergency Department (HOSPITAL_BASED_OUTPATIENT_CLINIC_OR_DEPARTMENT_OTHER): Payer: No Typology Code available for payment source

## 2015-01-03 ENCOUNTER — Emergency Department (HOSPITAL_BASED_OUTPATIENT_CLINIC_OR_DEPARTMENT_OTHER)
Admission: EM | Admit: 2015-01-03 | Discharge: 2015-01-03 | Disposition: A | Discharge status: Home / Self Care | Payer: No Typology Code available for payment source | Attending: Emergency Medicine | Admitting: Emergency Medicine

## 2015-01-03 DIAGNOSIS — M1990 Unspecified osteoarthritis, unspecified site: Secondary | ICD-10-CM | POA: Diagnosis not present

## 2015-01-03 DIAGNOSIS — J449 Chronic obstructive pulmonary disease, unspecified: Secondary | ICD-10-CM | POA: Insufficient documentation

## 2015-01-03 DIAGNOSIS — Z87440 Personal history of urinary (tract) infections: Secondary | ICD-10-CM | POA: Diagnosis not present

## 2015-01-03 DIAGNOSIS — Y92128 Other place in nursing home as the place of occurrence of the external cause: Secondary | ICD-10-CM | POA: Diagnosis not present

## 2015-01-03 DIAGNOSIS — S52532A Colles' fracture of left radius, initial encounter for closed fracture: Secondary | ICD-10-CM | POA: Insufficient documentation

## 2015-01-03 DIAGNOSIS — Z7951 Long term (current) use of inhaled steroids: Secondary | ICD-10-CM | POA: Insufficient documentation

## 2015-01-03 DIAGNOSIS — W1839XA Other fall on same level, initial encounter: Secondary | ICD-10-CM | POA: Insufficient documentation

## 2015-01-03 DIAGNOSIS — S6992XA Unspecified injury of left wrist, hand and finger(s), initial encounter: Secondary | ICD-10-CM | POA: Diagnosis present

## 2015-01-03 DIAGNOSIS — F0390 Unspecified dementia without behavioral disturbance: Secondary | ICD-10-CM | POA: Diagnosis not present

## 2015-01-03 DIAGNOSIS — Z79899 Other long term (current) drug therapy: Secondary | ICD-10-CM | POA: Insufficient documentation

## 2015-01-03 DIAGNOSIS — M8580 Other specified disorders of bone density and structure, unspecified site: Secondary | ICD-10-CM | POA: Diagnosis not present

## 2015-01-03 DIAGNOSIS — Z7982 Long term (current) use of aspirin: Secondary | ICD-10-CM | POA: Diagnosis not present

## 2015-01-03 DIAGNOSIS — F329 Major depressive disorder, single episode, unspecified: Secondary | ICD-10-CM | POA: Insufficient documentation

## 2015-01-03 DIAGNOSIS — H409 Unspecified glaucoma: Secondary | ICD-10-CM | POA: Insufficient documentation

## 2015-01-03 DIAGNOSIS — Z8639 Personal history of other endocrine, nutritional and metabolic disease: Secondary | ICD-10-CM | POA: Insufficient documentation

## 2015-01-03 DIAGNOSIS — I10 Essential (primary) hypertension: Secondary | ICD-10-CM | POA: Diagnosis not present

## 2015-01-03 DIAGNOSIS — Y9389 Activity, other specified: Secondary | ICD-10-CM | POA: Insufficient documentation

## 2015-01-03 DIAGNOSIS — Y998 Other external cause status: Secondary | ICD-10-CM | POA: Diagnosis not present

## 2015-01-03 DIAGNOSIS — K219 Gastro-esophageal reflux disease without esophagitis: Secondary | ICD-10-CM | POA: Insufficient documentation

## 2015-01-03 HISTORY — DX: Unspecified dementia, unspecified severity, without behavioral disturbance, psychotic disturbance, mood disturbance, and anxiety: F03.90

## 2015-01-03 HISTORY — DX: Disorder of thyroid, unspecified: E07.9

## 2015-01-03 HISTORY — DX: Repeated falls: R29.6

## 2015-01-03 HISTORY — DX: Chronic obstructive pulmonary disease, unspecified: J44.9

## 2015-01-03 HISTORY — DX: Hyperlipidemia, unspecified: E78.5

## 2015-01-03 HISTORY — DX: Urinary tract infection, site not specified: A49.9

## 2015-01-03 HISTORY — DX: Urinary tract infection, site not specified: N39.0

## 2015-01-03 HISTORY — DX: Gastro-esophageal reflux disease without esophagitis: K21.9

## 2015-01-03 HISTORY — DX: Unspecified glaucoma: H40.9

## 2015-01-03 NOTE — Discharge Instructions (Signed)
Colles Fracture °Colles fracture is a type of broken wrist. It means that your radius bone is broken or cracked. The radius is the bone of your forearm on the same side as your thumb. The forearm is the part of your arm that is between your elbow and your wrist. Your forearm is made up of two bones. The other bone is called the ulna. Often, when the radius is broken, the ulna may also be broken. °A cast or splint is used to protect and prevent your injured bone from moving as it heals. °CAUSES °Common causes of this type of fracture include: °· A hard, direct hit to the wrist. °· Falling on an outstretched hand. °· Trauma, such as a car accident or a fall. °RISK FACTORS °You may be at higher risk for this type of fracture if: °· You participate in contact sports and high-risk sports, such as skiing, biking, and ice skating. °· You smoke. °· You drink more than three alcoholic beverages per day. °· You have low or lowered bone density (osteoporosis or osteopenia). °· You are a young child or an older adult. °· You are a woman who has gone through menopause. °· You have a history of previous fractures. °· You are not getting enough calcium or vitamin D. °SIGNS AND SYMPTOMS °Symptoms of Colles fracture may include tenderness, bruising, and inflammation. Additionally, your wrist may hang in an odd position, appear deformed, and be difficult to move. °DIAGNOSIS °Diagnosis may include: °· Physical exam. °· X-ray. °TREATMENT °Treatment depends on many factors, including the severity of the fracture, your age, and your activity level. Treatment for Colles fracture can be nonsurgical or surgical. °Nonsurgical Treatment °A cast or a splint may be applied to your wrist if the bone is in a good position. If the fracture is not in a good position, it may be necessary for your health care provider to realign it before applying a cast or a splint. Usually, a cast or a splint will be worn for several weeks. °Surgical  Treatment °Sometimes, if the fractured bone is severely displaced, surgery is required to help hold it together as it heals. Depending on the fracture, there are a number of options for holding the bone in place while it heals, such as a cast and metal pins. °HOME CARE INSTRUCTIONS °If You Have a Cast: °· Do not stick anything inside the cast to scratch your skin. Doing that increases your risk of infection. °· Check the skin around the cast every day. Report any concerns to your health care provider. You may put lotion on dry skin around the edges of the cast. Do not apply lotion to the skin underneath the cast. °If You Have a Splint: °· Wear it as directed by your health care provider. Remove it only as directed by your health care provider. °· Loosen the splint if your fingers become numb and tingle, or if they turn cold and blue. °Bathing °· Cover the cast or splint with a watertight plastic bag to protect it from water while you bathe or shower. Do not let the cast or splint get wet. °Managing Pain, Stiffness, and Swelling °· If directed, apply ice to the injured area: °¨ Put ice in a plastic bag. °¨ Place a towel between your skin and the bag. °¨ Leave the ice on for 20 minutes, 2-3 times a day. °· Move your fingers often to avoid stiffness and to lessen swelling. °· Raise the injured area above the level of   your heart while you are sitting or lying down. °Driving °· Do not drive or operate heavy machinery while taking pain medicine. °· Do not drive while wearing a cast or splint on a hand that you use for driving. °Activity °· Return to your normal activities as directed by your health care provider. Ask your health care provider what activities are safe for you. °· Perform range-of-motion exercises only as directed by your health care provider. °Safety °· Do not use your injured limb to support your body weight until your health care provider says that you can. °General Instructions °· Do not put pressure on  any part of the cast or splint until it is fully hardened. This may take several hours. °· Keep the cast or splint clean and dry. °· Do not use any tobacco products, including cigarettes, chewing tobacco, or electronic cigarettes. Tobacco can delay bone healing. If you need help quitting, ask your health care provider. °· Take medicines only as directed by your health care provider. °· Keep all follow-up visits as directed by your health care provider. This is important. °SEEK MEDICAL CARE IF: °· Your cast or splint becomes wet or damaged or suddenly feels too tight. °· You have a fever. °· You have chills. °· You have continued severe pain or more swelling than you did before the cast or splint was put on your wrist. °SEEK IMMEDIATE MEDICAL CARE IF: °· Your hand or fingernails on the injured arm turn blue or gray, or they feel cold or numb. °· You have decreased feeling in the fingers of your injured arm. °  °This information is not intended to replace advice given to you by your health care provider. Make sure you discuss any questions you have with your health care provider. °  °Document Released: 02/08/2006 Document Revised: 02/13/2014 Document Reviewed: 09/08/2013 °Elsevier Interactive Patient Education ©2016 [employer] ° °

## 2015-01-03 NOTE — ED Notes (Signed)
Pt is a resident at [facility]. Pt reports fall this afternoon and had mobile xray with showed left wrist Fx. Transported to ED by family

## 2015-01-03 NOTE — ED Provider Notes (Signed)
CSN: [id number]     Arrival date & time 01/03/15  1725 History  By signing my name below, I, Jarvis Morgan, attest that this documentation has been prepared under the direction and in the presence of Rolan Bucco, MD. Electronically Signed: Jarvis Morgan, ED Scribe. 01/03/2015. 8:50 PM.    Chief Complaint  Patient presents with  . Fall   The history is provided by the patient and a relative (daughter). No language interpreter was used.    HPI Comments: Lynn Fischer is a 81 y.o. female with a h/o HTN, asthma, osteoarthritis, osteopenia, dementia who presents to the Emergency Department complaining of constant, moderate, left wrist pain onset today s/p fall that occurred around noon at her nursing home, [facility]. She endorses associated swelling to the left wrist and small wound to left hand. Pt's daughter reports that she was getting up out of her bed and she fell onto her left wrist. Pt's daughter notes that she had an x-ray done at [facility] which showed a fracture to her left wrist and was told to come to ER for evaluation. Per x-ray report pt had an x-ray of left humerus which showed no fracture, left elbow with no fracture and left wrist with buckle fracture of distal radius. Pt's daughter states the pt has had a prior fracture to the left wrist which she injured around 6 months ago due to a fall. Pt notes the wrist pain is exacerbated with attempted movement of the wrist. Pt has not had any medications prior to arrival. Pt denies any head injury or LOC in the fall. Pt denies any other injury in the fall. She denies any hip pain, shoulder pain, back pain, neck pain or other associated symptoms.   Past Medical History  Diagnosis Date  . Hypertension   . Asthma   . Depression   . Osteoarthritis   . Osteopenia   . Proctitis   . Benign essential hypertension   . Cerumen impaction   . Falls frequently   . Dementia   . Thyroid disease   . Hyperlipidemia   . Glaucoma   .  Urinary tract bacterial infections   . GERD (gastroesophageal reflux disease)   . COPD (chronic obstructive pulmonary disease) [facility])    Past Surgical History  Procedure Laterality Date  . Appendectomy    . Back surgery    . Knee surgery      bilateral  . Nm esophageal reflux    . Abdominal hysterectomy    . Bladder surgery     No family history on file. Social History  Substance Use Topics  . Smoking status: Never Smoker   . Smokeless tobacco: None  . Alcohol Use: No   OB History    No data available     Review of Systems  Constitutional: Negative for fever, chills, diaphoresis and fatigue.  HENT: Negative for congestion, rhinorrhea and sneezing.   Eyes: Negative.   Respiratory: Negative for cough, chest tightness and shortness of breath.   Cardiovascular: Negative for chest pain and leg swelling.  Gastrointestinal: Negative for nausea, vomiting, abdominal pain, diarrhea and blood in stool.  Genitourinary: Negative for frequency, hematuria, flank pain and difficulty urinating.  Musculoskeletal: Positive for joint swelling and arthralgias. Negative for back pain.  Skin: Positive for wound. Negative for rash.  Neurological: Negative for dizziness, speech difficulty, weakness, numbness and headaches.      Allergies  Review of patient's allergies indicates no known allergies.  Home Medications  Prior to Admission medications   Medication Sig Start Date End Date Taking? Authorizing Provider  acetaminophen (TYLENOL) 325 MG tablet Take 325 mg by mouth every 6 (six) hours as needed.   Yes Historical Provider, MD  benzonatate (TESSALON) 100 MG capsule Take by mouth 3 (three) times daily as needed for cough.   Yes Historical Provider, MD  cetirizine (ZYRTEC) 5 MG tablet Take 5 mg by mouth daily.   Yes Historical Provider, MD  LORazepam (ATIVAN) 0.5 MG tablet Take 0.5 mg by mouth every 8 (eight) hours as needed for anxiety.   Yes Historical Provider, MD  ondansetron (ZOFRAN)  4 MG tablet Take 4 mg by mouth every 8 (eight) hours as needed for nausea or vomiting.   Yes Historical Provider, MD  albuterol (PROVENTIL HFA;VENTOLIN HFA) 108 (90 BASE) MCG/ACT inhaler Inhale 2 puffs into the lungs every 6 (six) hours as needed.    Historical Provider, MD  aspirin 81 MG tablet Take 81 mg by mouth daily.    Historical Provider, MD  beclomethasone (QVAR) 80 MCG/ACT inhaler Inhale 2 puffs into the lungs 2 (two) times daily.    Historical Provider, MD  brimonidine (ALPHAGAN) 0.2 % ophthalmic solution 1 drop 3 (three) times daily.    Historical Provider, MD  Calcium Carbonate-Vitamin D (CALTRATE 600+D) 600-400 MG-UNIT per chew tablet Chew 1 tablet by mouth daily.    Historical Provider, MD  Cholecalciferol (VITAMIN D-3) 1000 UNITS CAPS Take by mouth.    Historical Provider, MD  cholestyramine Lanetta Inch) 4 GM/DOSE powder Take 1 g by mouth 2 (two) times daily with a meal.    Historical Provider, MD  FLUoxetine (PROZAC) 10 MG capsule Take 10 mg by mouth daily.    Historical Provider, MD  HYDROcodone-acetaminophen (VICODIN) 5-500 MG per tablet Take 1 tablet by mouth every 6 (six) hours as needed.    Historical Provider, MD  latanoprost (XALATAN) 0.005 % ophthalmic solution Place 1 drop into both eyes daily.    Historical Provider, MD  metoprolol succinate (TOPROL-XL) 25 MG 24 hr tablet Take 25 mg by mouth daily.    Historical Provider, MD  mometasone (NASONEX) 50 MCG/ACT nasal spray Place 2 sprays into the nose daily.    Historical Provider, MD  mometasone-formoterol (DULERA) 100-5 MCG/ACT AERO Inhale 2 puffs into the lungs 2 (two) times daily.    Historical Provider, MD  montelukast (SINGULAIR) 10 MG tablet Take 10 mg by mouth at bedtime.    Historical Provider, MD  nabumetone (RELAFEN) 500 MG tablet Take 500 mg by mouth 2 (two) times daily.    Historical Provider, MD  RABEprazole (ACIPHEX) 20 MG tablet Take 20 mg by mouth daily.    Historical Provider, MD  valsartan-hydrochlorothiazide  (DIOVAN-HCT) 320-12.5 MG per tablet Take 1 tablet by mouth daily.    Historical Provider, MD  vitamin E (VITAMIN E) 1000 UNIT capsule Take 1,000 Units by mouth daily.    Historical Provider, MD   Triage Vitals: BP 189/95 mmHg  Pulse 68  Temp(Src) 97.7 F (36.5 C) (Oral)  Resp 18  Ht 5' (1.524 m)  Wt 192 lb (87.091 kg)  BMI 37.50 kg/m2  SpO2 99%  Physical Exam  Constitutional: She is oriented to person, place, and time. She appears well-developed and well-nourished.  HENT:  Head: Normocephalic and atraumatic.  Eyes: Pupils are equal, round, and reactive to light.  Neck: Normal range of motion. Neck supple.  Cardiovascular: Normal rate, regular rhythm and normal heart sounds.   Pulmonary/Chest: Effort normal  and breath sounds normal. No respiratory distress. She has no wheezes. She has no rales. She exhibits no tenderness.  Abdominal: Soft. Bowel sounds are normal. There is no tenderness. There is no rebound and no guarding.  Musculoskeletal: Normal range of motion. She exhibits no edema.       Left wrist: She exhibits deformity.  deformity to left wrist which daughter states is chronic, she has tenderness to the left wrist and left elbow. There is no tenderness to the humerus or left shoulder. No other pain on palpation or range of motion extremities. There is no pain along the spine  Lymphadenopathy:    She has no cervical adenopathy.  Neurological: She is alert and oriented to person, place, and time.  Skin: Skin is warm and dry. No rash noted.  Skin tear to her lateral left hand  Psychiatric: She has a normal mood and affect.    ED Course  Procedures (including critical care time)  DIAGNOSTIC STUDIES: Oxygen Saturation is 99% on RA, normal by my interpretation.    COORDINATION OF CARE: 8:38 PM - Will re-xray left elbow and left wrist.  Pt advised of plan for treatment and pt agrees.   Labs Review Labs Reviewed - No data to display  Imaging Review Dg Elbow Complete  Left  01/03/2015  CLINICAL DATA:  Larey SeatFell at nursing home today, left elbow pain EXAM: LEFT ELBOW - COMPLETE 3+ VIEW COMPARISON:  None. FINDINGS: The study is limited by suboptimal positioning on the lateral radiograph. Allowing for this, there is no fracture dislocation or joint effusion. IMPRESSION: Suboptimal positioning on lateral view.  No acute findings. Electronically Signed   By: Esperanza Heiraymond  Rubner M.D.   On: 01/03/2015 20:16   Dg Wrist Complete Left  01/03/2015  CLINICAL DATA:  Larey SeatFell at nursing home today, left wrist pain EXAM: LEFT WRIST - COMPLETE 3+ VIEW COMPARISON:  None. FINDINGS: There is an impacted fracture of the distal radial metaphysis. No other fractures identified. There is mild to moderate apex volar angulation. IMPRESSION: Impacted angulated fracture of the distal radial metaphysis Electronically Signed   By: Esperanza Heiraymond  Rubner M.D.   On: 01/03/2015 20:18   I have personally reviewed and evaluated these images as part of my medical decision-making.   EKG Interpretation None      MDM   Final diagnoses:  Colles' fracture of left radius, closed, initial encounter    Patient has significant tenderness to the left elbow and left wrist. I did re-x-ray these areas. There is no visible fracture to the elbow. There is an impacted, angulated, Colles' type fracture of the left wrist. It appears to be an acute injury. Patient's daughter once patient to [place] [facility]. She was placed in a sugar tong splint. I advised her to follow-up within the next 2 days with her orthopedist. She appeared to have mechanical fall. There is no other suggestions of recent illnesses or change in mental status. There is no evidence of head trauma.  I personally performed the services described in this documentation, which was scribed in my presence.  The recorded information has been reviewed and considered.     Rolan BuccoMelanie Braxson Hollingsworth, MD 01/03/15 2053

## 2015-12-25 ENCOUNTER — Emergency Department (HOSPITAL_BASED_OUTPATIENT_CLINIC_OR_DEPARTMENT_OTHER)
Admission: EM | Admit: 2015-12-25 | Discharge: 2015-12-26 | Disposition: A | Discharge status: Home / Self Care | Payer: No Typology Code available for payment source | Attending: Emergency Medicine | Admitting: Emergency Medicine

## 2015-12-25 ENCOUNTER — Emergency Department (HOSPITAL_BASED_OUTPATIENT_CLINIC_OR_DEPARTMENT_OTHER): Payer: No Typology Code available for payment source

## 2015-12-25 ENCOUNTER — Encounter (HOSPITAL_BASED_OUTPATIENT_CLINIC_OR_DEPARTMENT_OTHER): Payer: Self-pay

## 2015-12-25 DIAGNOSIS — S32000A Wedge compression fracture of unspecified lumbar vertebra, initial encounter for closed fracture: Secondary | ICD-10-CM

## 2015-12-25 DIAGNOSIS — Y999 Unspecified external cause status: Secondary | ICD-10-CM | POA: Diagnosis not present

## 2015-12-25 DIAGNOSIS — N390 Urinary tract infection, site not specified: Secondary | ICD-10-CM

## 2015-12-25 DIAGNOSIS — W1809XA Striking against other object with subsequent fall, initial encounter: Secondary | ICD-10-CM | POA: Diagnosis not present

## 2015-12-25 DIAGNOSIS — M4856XA Collapsed vertebra, not elsewhere classified, lumbar region, initial encounter for fracture: Secondary | ICD-10-CM | POA: Diagnosis not present

## 2015-12-25 DIAGNOSIS — Z7982 Long term (current) use of aspirin: Secondary | ICD-10-CM | POA: Diagnosis not present

## 2015-12-25 DIAGNOSIS — F0390 Unspecified dementia without behavioral disturbance: Secondary | ICD-10-CM | POA: Insufficient documentation

## 2015-12-25 DIAGNOSIS — Y92129 Unspecified place in nursing home as the place of occurrence of the external cause: Secondary | ICD-10-CM | POA: Diagnosis not present

## 2015-12-25 DIAGNOSIS — R410 Disorientation, unspecified: Secondary | ICD-10-CM | POA: Diagnosis not present

## 2015-12-25 DIAGNOSIS — J45909 Unspecified asthma, uncomplicated: Secondary | ICD-10-CM | POA: Insufficient documentation

## 2015-12-25 DIAGNOSIS — J449 Chronic obstructive pulmonary disease, unspecified: Secondary | ICD-10-CM | POA: Insufficient documentation

## 2015-12-25 DIAGNOSIS — I10 Essential (primary) hypertension: Secondary | ICD-10-CM | POA: Insufficient documentation

## 2015-12-25 DIAGNOSIS — Y939 Activity, unspecified: Secondary | ICD-10-CM | POA: Insufficient documentation

## 2015-12-25 DIAGNOSIS — W19XXXA Unspecified fall, initial encounter: Secondary | ICD-10-CM

## 2015-12-25 DIAGNOSIS — S0990XA Unspecified injury of head, initial encounter: Secondary | ICD-10-CM | POA: Diagnosis present

## 2015-12-25 HISTORY — DX: Transient cerebral ischemic attack, unspecified: G45.9

## 2015-12-25 HISTORY — DX: Do not resuscitate: Z66

## 2015-12-25 HISTORY — DX: Cerebral infarction, unspecified: I63.9

## 2015-12-25 LAB — URINALYSIS, ROUTINE W REFLEX MICROSCOPIC
Bilirubin Urine: NEGATIVE
Glucose, UA: NEGATIVE mg/dL
HGB URINE DIPSTICK: NEGATIVE
Ketones, ur: NEGATIVE mg/dL
NITRITE: POSITIVE — AB
PROTEIN: NEGATIVE mg/dL
SPECIFIC GRAVITY, URINE: 1.018 (ref 1.005–1.030)
pH: 6 (ref 5.0–8.0)

## 2015-12-25 LAB — URINE MICROSCOPIC-ADD ON

## 2015-12-25 NOTE — ED Provider Notes (Signed)
[facility] Provider Note   CSN: [id number] Arrival date & time: 12/25/15  2119  By signing my name below, I, Clarisse Gouge, attest that this documentation has been prepared under the direction and in the presence of Fayrene Helper. Electronically Signed: Clarisse Gouge, Scribe. 12/25/15. 9:59 PM.    History   Chief Complaint Chief Complaint  Patient presents with  . Fall   The history is provided by a relative and the patient. The history is limited by the condition of the patient. No language interpreter was used.   HPI Comments: Lynn Fischer is a 82 y.o. female with Hx of dementia who presents to the Emergency Department complaining of fall earlier today. Pt brought here from a memory care facility. Per daughter, she falls frequently and the fall was not witnessed. She states that she noticed gradual decline in pt's mental status leading up to event. Pt reports weakness. Daughter further reports that the pt was seen in the ED ~a year ago and she has been in a wheelchair for about the same amount of time. She reports possible head and bottom injury. Pt reports pain in neck with sudden movement. Pt denies current pain in neck, chest, abdomen, legs or bottom. Pt further denies fall. Level V caveat for dementia.    11:14 PM Nurse tech from facility report pt fell from a standing position, striking her head against the heater.  No LOC.    Past Medical History:  Diagnosis Date  . Asthma   . Benign essential hypertension   . Cerumen impaction   . COPD (chronic obstructive pulmonary disease) (HCC)   . Dementia   . Depression   . Falls frequently   . GERD (gastroesophageal reflux disease)   . Glaucoma   . Hyperlipidemia   . Hypertension   . Osteoarthritis   . Osteopenia   . Proctitis   . Thyroid disease   . Urinary tract bacterial infections     There are no active problems to display for this patient.   Past Surgical History:  Procedure Laterality Date  .  ABDOMINAL HYSTERECTOMY    . APPENDECTOMY    . BACK SURGERY    . BLADDER SURGERY    . KNEE SURGERY     bilateral  . NM ESOPHAGEAL REFLUX      OB History    No data available       Home Medications    Prior to Admission medications   Medication Sig Start Date End Date Taking? Authorizing Provider  acetaminophen (TYLENOL) 325 MG tablet Take 325 mg by mouth every 6 (six) hours as needed.    Historical Provider, MD  albuterol (PROVENTIL HFA;VENTOLIN HFA) 108 (90 BASE) MCG/ACT inhaler Inhale 2 puffs into the lungs every 6 (six) hours as needed.    Historical Provider, MD  aspirin 81 MG tablet Take 81 mg by mouth daily.    Historical Provider, MD  beclomethasone (QVAR) 80 MCG/ACT inhaler Inhale 2 puffs into the lungs 2 (two) times daily.    Historical Provider, MD  benzonatate (TESSALON) 100 MG capsule Take by mouth 3 (three) times daily as needed for cough.    Historical Provider, MD  brimonidine (ALPHAGAN) 0.2 % ophthalmic solution 1 drop 3 (three) times daily.    Historical Provider, MD  Calcium Carbonate-Vitamin D (CALTRATE 600+D) 600-400 MG-UNIT per chew tablet Chew 1 tablet by mouth daily.    Historical Provider, MD  cetirizine (ZYRTEC) 5 MG tablet Take 5 mg by mouth  daily.    Historical Provider, MD  Cholecalciferol (VITAMIN D-3) 1000 UNITS CAPS Take by mouth.    Historical Provider, MD  cholestyramine Lanetta Inch) 4 GM/DOSE powder Take 1 g by mouth 2 (two) times daily with a meal.    Historical Provider, MD  FLUoxetine (PROZAC) 10 MG capsule Take 10 mg by mouth daily.    Historical Provider, MD  HYDROcodone-acetaminophen (VICODIN) 5-500 MG per tablet Take 1 tablet by mouth every 6 (six) hours as needed.    Historical Provider, MD  latanoprost (XALATAN) 0.005 % ophthalmic solution Place 1 drop into both eyes daily.    Historical Provider, MD  LORazepam (ATIVAN) 0.5 MG tablet Take 0.5 mg by mouth every 8 (eight) hours as needed for anxiety.    Historical Provider, MD  metoprolol  succinate (TOPROL-XL) 25 MG 24 hr tablet Take 25 mg by mouth daily.    Historical Provider, MD  mometasone (NASONEX) 50 MCG/ACT nasal spray Place 2 sprays into the nose daily.    Historical Provider, MD  mometasone-formoterol (DULERA) 100-5 MCG/ACT AERO Inhale 2 puffs into the lungs 2 (two) times daily.    Historical Provider, MD  montelukast (SINGULAIR) 10 MG tablet Take 10 mg by mouth at bedtime.    Historical Provider, MD  nabumetone (RELAFEN) 500 MG tablet Take 500 mg by mouth 2 (two) times daily.    Historical Provider, MD  ondansetron (ZOFRAN) 4 MG tablet Take 4 mg by mouth every 8 (eight) hours as needed for nausea or vomiting.    Historical Provider, MD  RABEprazole (ACIPHEX) 20 MG tablet Take 20 mg by mouth daily.    Historical Provider, MD  valsartan-hydrochlorothiazide (DIOVAN-HCT) 320-12.5 MG per tablet Take 1 tablet by mouth daily.    Historical Provider, MD  vitamin E (VITAMIN E) 1000 UNIT capsule Take 1,000 Units by mouth daily.    Historical Provider, MD    Family History No family history on file.  Social History Social History  Substance Use Topics  . Smoking status: Never Smoker  . Smokeless tobacco: Never Used  . Alcohol use No     Allergies   Patient has no known allergies.   Review of Systems Review of Systems  Unable to perform ROS: Dementia  Cardiovascular: Negative for chest pain.  Gastrointestinal: Negative for abdominal pain.  Musculoskeletal: Positive for arthralgias and myalgias. Negative for neck pain.  Psychiatric/Behavioral: Positive for confusion.     Physical Exam Updated Vital Signs BP 159/73 (BP Location: Right Arm)   Pulse 77   Temp 98.4 F (36.9 C) (Oral)   Resp 20   SpO2 98%   Physical Exam  Constitutional: She is oriented to person, place, and time. She appears well-developed and well-nourished.  Obese female laying in bed in no acute discomfort.  HENT:  Head: Normocephalic.  Tenderness noted to posterior scalp without  crepitus.  Eyes: EOM are normal.  Neck: Normal range of motion.  Tenderness to cervical spine.  Pulmonary/Chest: Effort normal.  Abdominal: She exhibits no distension.  Musculoskeletal: Normal range of motion.       Arms: Tenderness to midline lumbar region.  Neurological: She is alert and oriented to person, place, and time.  Psychiatric: She has a normal mood and affect.  Nursing note and vitals reviewed.    ED Treatments / Results  DIAGNOSTIC STUDIES: Oxygen Saturation is 98% on RA, normal by my interpretation.    COORDINATION OF CARE: 10:12 PM Shoulder pain suspected to be chronic. Will order imaging and urinalysis.  Discussed treatment plan with pt at bedside and pt agreed to plan.   Labs (all labs ordered are listed, but only abnormal results are displayed) Labs Reviewed  URINALYSIS, ROUTINE W REFLEX MICROSCOPIC (NOT [facility]) - Abnormal; Notable for the following:       Result Value   APPearance CLOUDY (*)    Nitrite POSITIVE (*)    Leukocytes, UA MODERATE (*)    All other components within normal limits  URINE MICROSCOPIC-ADD ON - Abnormal; Notable for the following:    Squamous Epithelial / LPF 0-5 (*)    Bacteria, UA MANY (*)    All other components within normal limits  URINE CULTURE    EKG  EKG Interpretation  Date/Time:  Saturday December 25 2015 22:49:57 EST Ventricular Rate:  77 PR Interval:    QRS Duration: 97 QT Interval:  368 QTC Calculation: 417 R Axis:   39 Text Interpretation:  Sinus rhythm Low voltage, precordial leads Minimal ST depression, lateral leads since last tracing no significant change Confirmed by BELFI  MD, MELANIE ([id number]) on 12/25/2015 11:27:37 PM       Radiology Dg Lumbar Spine Complete  Result Date: 12/26/2015 CLINICAL DATA:  Low back pain. EXAM: LUMBAR SPINE - COMPLETE 4+ VIEW COMPARISON:  September 02, 2010 FINDINGS: A screw is seen posteriorly at L5, unchanged. A disc spacer device is seen at L4-5, unchanged. Grade 1  anterolisthesis of L4 versus L5 has increased in the interval. Loss of height of L1, approximately 15%, is new since 2012. No other fractures or malalignment identified. IMPRESSION: 1. Stable support apparatus in the lower lumbar spine. 2. Increased grade 1 anterolisthesis of L4 versus L5. 3. 15% loss of height of the L1 vertebral body, new since 2012, an age indeterminate. Recommend clinical correlation. 4. Scattered degenerative changes. Electronically Signed   By: Gerome Samavid  Williams III M.D   On: 12/26/2015 00:31   Dg Shoulder Right  Result Date: 12/26/2015 CLINICAL DATA:  Pain after fall. EXAM: RIGHT SHOULDER - 2+ VIEW COMPARISON:  None. FINDINGS: The transscapular Y-view is limited due to positioning. However, based on the 2 provided images, there is no evidence of dislocation. No acute fractures are seen. IMPRESSION: The study is limited due to poor positioning of the transscapular Y-view. However, no convincing evidence of fracture or dislocation. Electronically Signed   By: Gerome Samavid  Williams III M.D   On: 12/26/2015 00:32   Ct Head Wo Contrast  Result Date: 12/26/2015 CLINICAL DATA:  Pain after fall. EXAM: CT HEAD WITHOUT CONTRAST CT CERVICAL SPINE WITHOUT CONTRAST TECHNIQUE: Multidetector CT imaging of the head and cervical spine was performed following the standard protocol without intravenous contrast. Multiplanar CT image reconstructions of the cervical spine were also generated. COMPARISON:  None. FINDINGS: CT HEAD FINDINGS Brain: No subdural, epidural, or subarachnoid hemorrhage. Prominence of ventricles and sulci is stable. White matter changes are stable. No acute cortical ischemia or infarct. Cerebellum and brainstem are normal. Basal cisterns are widely patent. No mass, mass effect, or midline shift. Vascular: No hyperdense vessel or unexpected calcification. Skull: There is an old fracture through the medial wall of the right orbit. No acute fractures. Sinuses/Orbits: There is debris in the  sphenoid sinus of doubtful acute significance. Paranasal sinuses are otherwise unremarkable. The left mastoid air cells and bilateral middle ears are well aerated. There is opacification of scattered right mastoid air cells, stable in the interval of no acute significance. Other: None. CT CERVICAL SPINE FINDINGS Alignment: Minimal anterolisthesis of C3 versus  C4 is stable. Minimal anterior listhesis of C4 versus C5 is also stable. No other malalignment. Skull base and vertebrae: No acute fractures. Soft tissues and spinal canal: No prevertebral fluid or swelling. No visible canal hematoma. Disc levels: Multilevel degenerative changes identified including in the facets. Upper chest: Negative. Other: No other abnormalities. IMPRESSION: 1. No acute intracranial process. 2. No fracture or traumatic malalignment in the cervical spine. Electronically Signed   By: Gerome Samavid  Williams III M.D   On: 12/26/2015 00:46   Ct Cervical Spine Wo Contrast  Result Date: 12/26/2015 CLINICAL DATA:  Pain after fall. EXAM: CT HEAD WITHOUT CONTRAST CT CERVICAL SPINE WITHOUT CONTRAST TECHNIQUE: Multidetector CT imaging of the head and cervical spine was performed following the standard protocol without intravenous contrast. Multiplanar CT image reconstructions of the cervical spine were also generated. COMPARISON:  None. FINDINGS: CT HEAD FINDINGS Brain: No subdural, epidural, or subarachnoid hemorrhage. Prominence of ventricles and sulci is stable. White matter changes are stable. No acute cortical ischemia or infarct. Cerebellum and brainstem are normal. Basal cisterns are widely patent. No mass, mass effect, or midline shift. Vascular: No hyperdense vessel or unexpected calcification. Skull: There is an old fracture through the medial wall of the right orbit. No acute fractures. Sinuses/Orbits: There is debris in the sphenoid sinus of doubtful acute significance. Paranasal sinuses are otherwise unremarkable. The left mastoid air cells  and bilateral middle ears are well aerated. There is opacification of scattered right mastoid air cells, stable in the interval of no acute significance. Other: None. CT CERVICAL SPINE FINDINGS Alignment: Minimal anterolisthesis of C3 versus C4 is stable. Minimal anterior listhesis of C4 versus C5 is also stable. No other malalignment. Skull base and vertebrae: No acute fractures. Soft tissues and spinal canal: No prevertebral fluid or swelling. No visible canal hematoma. Disc levels: Multilevel degenerative changes identified including in the facets. Upper chest: Negative. Other: No other abnormalities. IMPRESSION: 1. No acute intracranial process. 2. No fracture or traumatic malalignment in the cervical spine. Electronically Signed   By: Gerome Samavid  Williams III M.D   On: 12/26/2015 00:46   Dg Shoulder Left  Result Date: 12/26/2015 CLINICAL DATA:  Larey SeatFell tonight at 23:30. EXAM: LEFT SHOULDER - 2+ VIEW COMPARISON:  None. FINDINGS: Two views of the left shoulder are negative for acute fracture or dislocation. There is severe glenohumeral arthritis. No bone lesion or bone destruction. IMPRESSION: Negative for acute fracture or dislocation. Severe left shoulder arthritis. Electronically Signed   By: Ellery Plunkaniel R Mitchell M.D.   On: 12/26/2015 00:32    Procedures Procedures (including critical care time)  Medications Ordered in ED Medications - No data to display   Initial Impression / Assessment and Plan / ED Course  I have reviewed the triage vital signs and the nursing notes.  Pertinent labs & imaging results that were available during my care of the patient were reviewed by me and considered in my medical decision making (see chart for details).  Clinical Course     BP 152/74   Pulse 88   Temp 98.4 F (36.9 C) (Oral)   Resp 20   SpO2 99%    Final Clinical Impressions(s) / ED Diagnoses   Final diagnoses:  Fall at nursing home, initial encounter  Lumbar compression fracture, closed, initial  encounter (HCC)  Acute lower UTI (urinary tract infection)    New Prescriptions New Prescriptions   CEPHALEXIN (KEFLEX) 500 MG CAPSULE    2 caps po bid x 7 days  I personally performed  the services described in this documentation, which was scribed in my presence. The recorded information has been reviewed and is accurate.     10:52 PM Pt with hx of recurrent falls and dementia, lives in a nursing facility.  EMS brought pt here.  It was document that pt's R leg was short and rotated by one account and L leg was short and rotated by another account.  However on exam pt has no hip tenderness and no shorten of legs. Plan to obtain head and cervical spine CT, xray of L shoulder and lumbar spine, will check UA and EKG.  Care discussed with Dr. Fredderick Phenix.   12:56 AM Head and cervical spine CT neg for acute injury.  Lspine with questionable compression fx.  UA positive for UTI.  Urine culture sent.  Keflex prescribed.  Daughter sts pt has had compression fx of her lower spine in the past.  Unsure if this is new.  Tylenol for pain.  Pt discharge back to facility.  Return precaution discussed .    Fayrene Helper, PA-C 12/26/15 1610    Rolan Bucco, MD 12/26/15 1500

## 2015-12-25 NOTE — ED Notes (Signed)
To xray/ CT

## 2015-12-25 NOTE — ED Notes (Signed)
EDPA into room 

## 2015-12-25 NOTE — ED Notes (Signed)
Per EMS pt fell at Nursing Home, EMS was told by one nurse that pt's Rt leg was short and rotated and then they were told by another nurse that pt's Lt leg was short and rotated.  They told EMS that pt hit her head on the heater in her room.  Per EMS pt has no bruises, lacerations or hematoma any where on her head.  Pt's vss.  Per EMS pt has dementia.

## 2015-12-25 NOTE — ED Notes (Signed)
Marvia Picklesia Davis, RN from [facility] called to give information on pt's fall, CNA witnessed the fall, fall was from a standing position to the floor, pt hit head on the heater, nurse felt goose egg behind pt's right ear, no LOC and no vomiting per nurse.

## 2015-12-26 MED ORDER — CEPHALEXIN 500 MG PO CAPS: ORAL_CAPSULE | ORAL | 0 refills | Status: AC

## 2015-12-26 NOTE — Discharge Instructions (Signed)
You have been evaluated for a fall.  You may have a compression fracture of your lumbar spine.  Take tylenol as needed for pain.  You have a urinary tract infection.  Take antibiotic as prescribed.  Follow up with your doctor for further care.

## 2015-12-26 NOTE — ED Notes (Signed)
[facility] called to transport back to [facility] RN aware.

## 2015-12-27 LAB — URINE CULTURE

## 2017-01-06 DEATH — deceased

## 2018-07-26 IMAGING — CT CT HEAD W/O CM
4 of 5 series · 15 of 47 positions shown, 17 images · non-contrast
Comparison: None.

CLINICAL DATA: Pain after fall.

EXAM:
CT HEAD WITHOUT CONTRAST
CT CERVICAL SPINE WITHOUT CONTRAST
TECHNIQUE: Multidetector CT imaging of the head and cervical spine was
performed following the standard protocol without intravenous
contrast. Multiplanar CT image reconstructions of the cervical spine
were also generated.

[Series 2: head 5.0 h30s · axial · 0.44mm/px · z∈[+562,+672]mm · 7 of 30 slices shown, 9 images]
[im 4/30  brain]
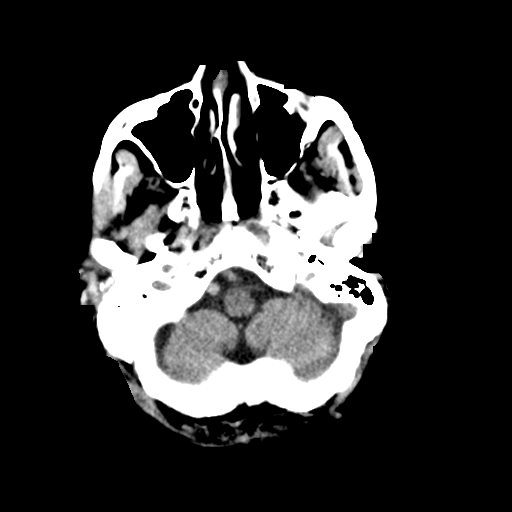
[im 4/30  bone]
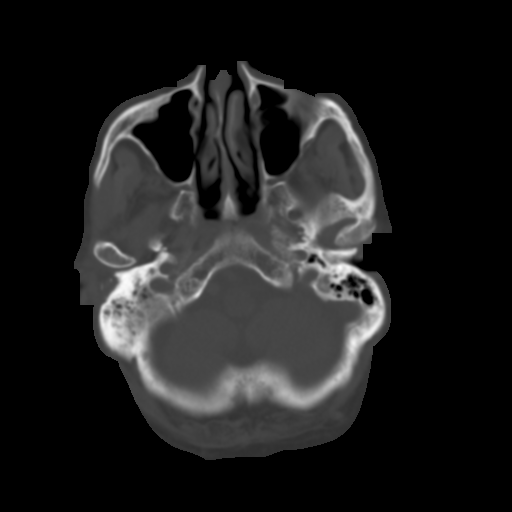
[im 8/30  brain]
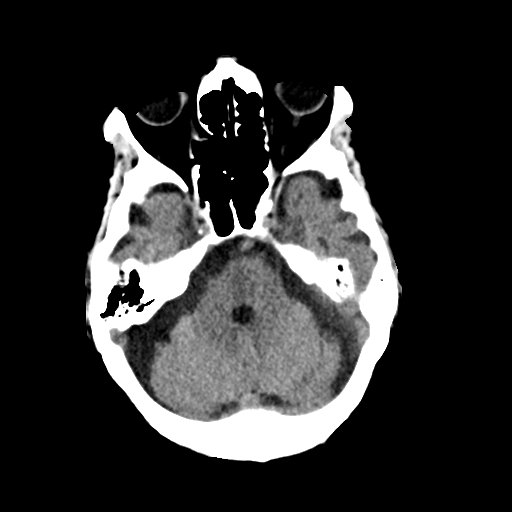
[im 11/30  brain]
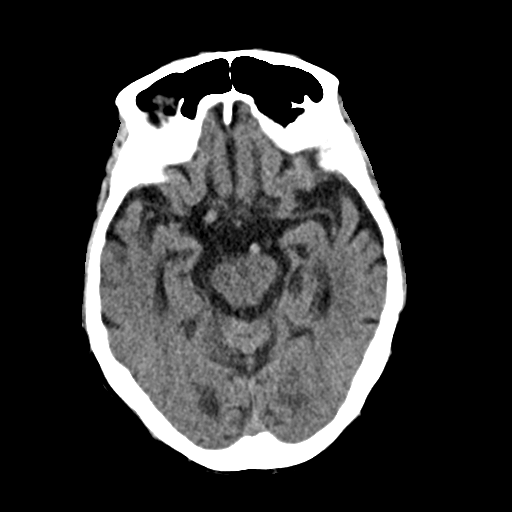
[im 15/30  brain]
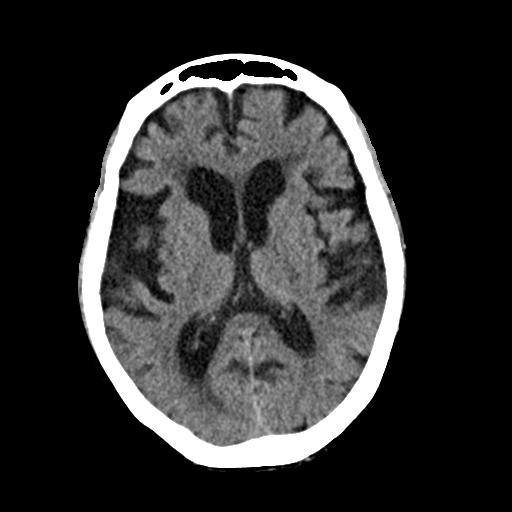
[im 19/30  brain]
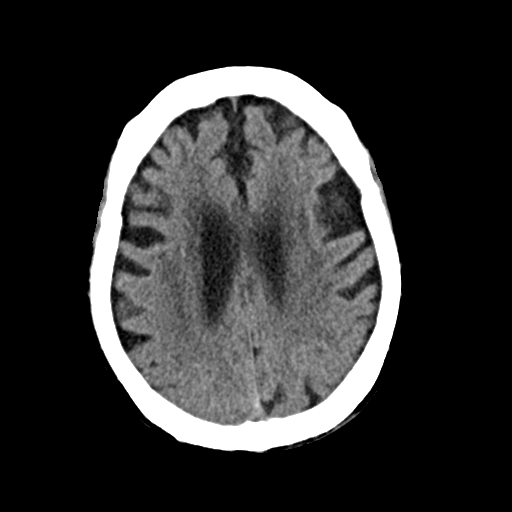
[im 19/30  bone]
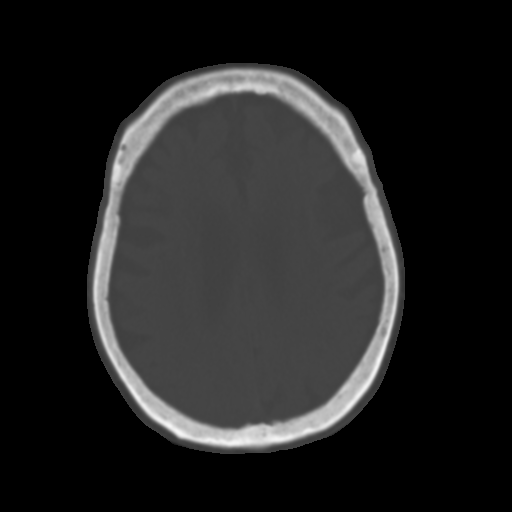
[im 22/30  brain]
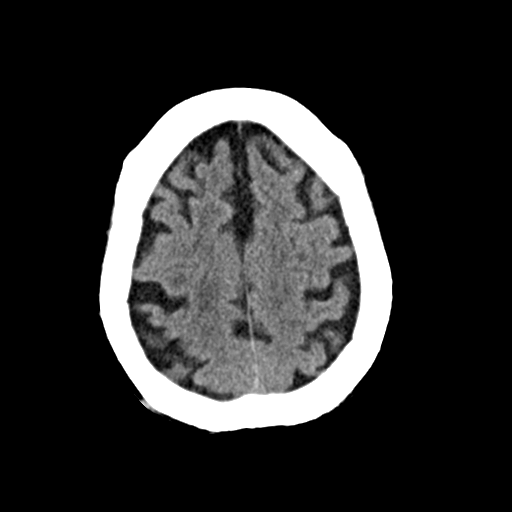
[im 26/30  brain]
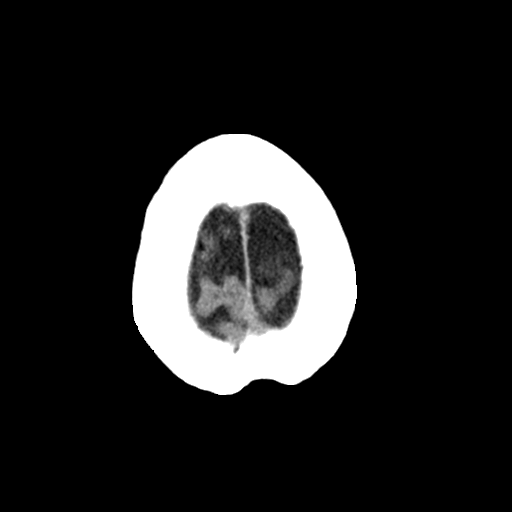

[Series 4: head 3.0 mpr cor · coronal · 0.30mm/px · 3 of 69 slices shown]
[im 23/69  brain]
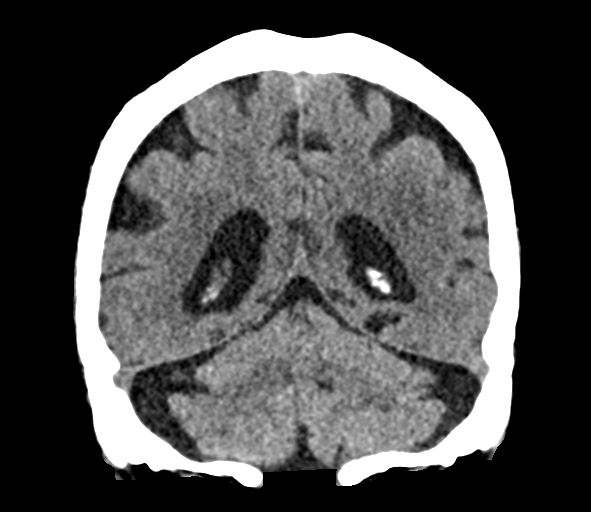
[im 31/69  brain]
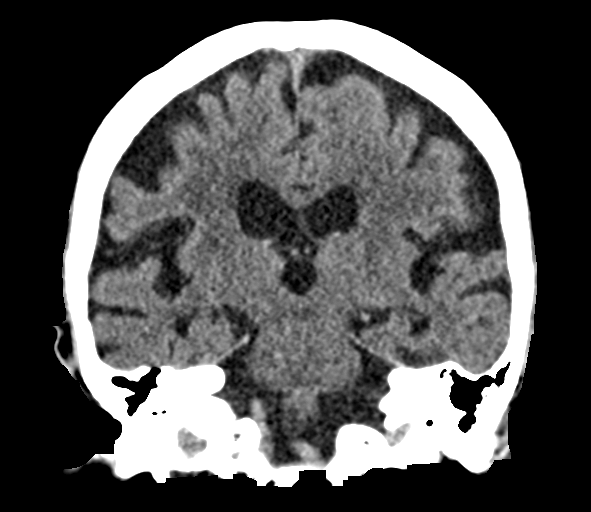
[im 38/69  brain]
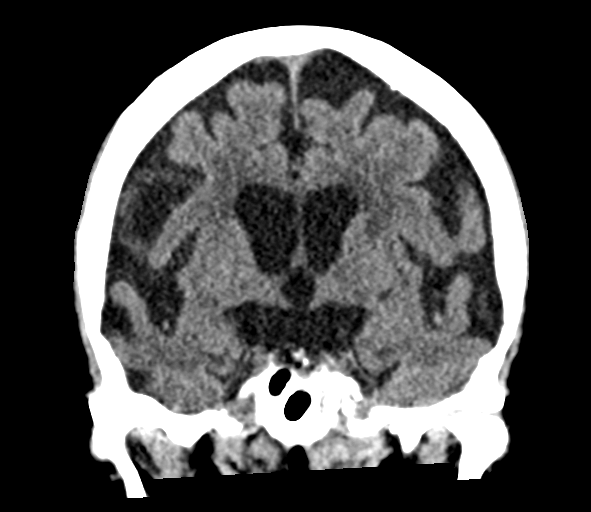

[Series 5: head 3.0 mpr sag · sagittal · 0.29mm/px · 2 of 59 slices shown]
[im 20/59  brain]
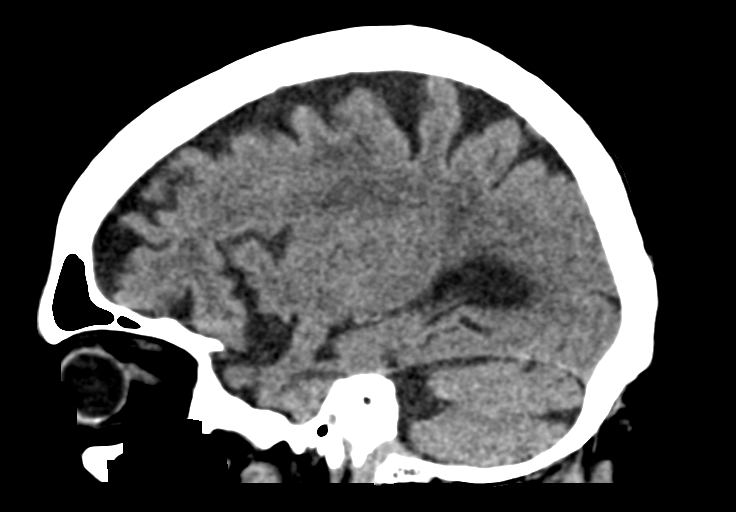
[im 39/59  brain]
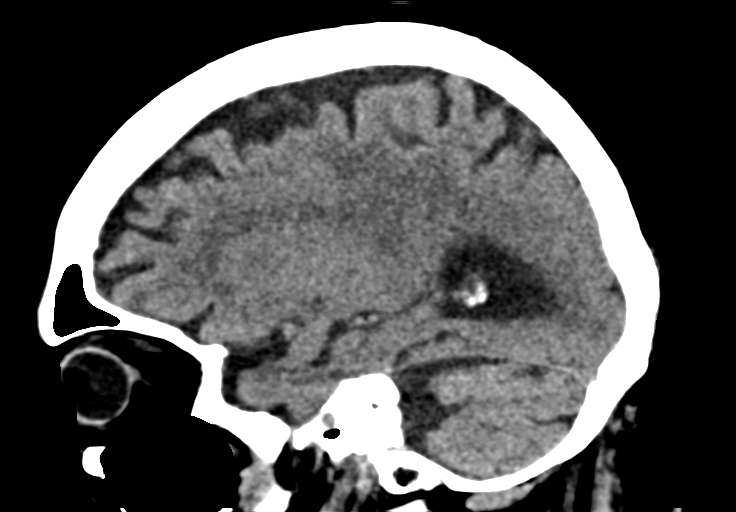

[Series 7: c_spine 2.0 i30s 3 · axial · 0.36mm/px · z∈[+462,+490]mm · 3 of 71 slices shown]
[im 8/71  brain]
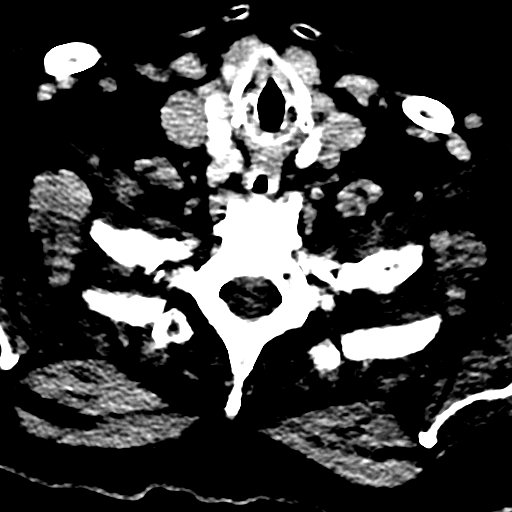
[im 15/71  brain]
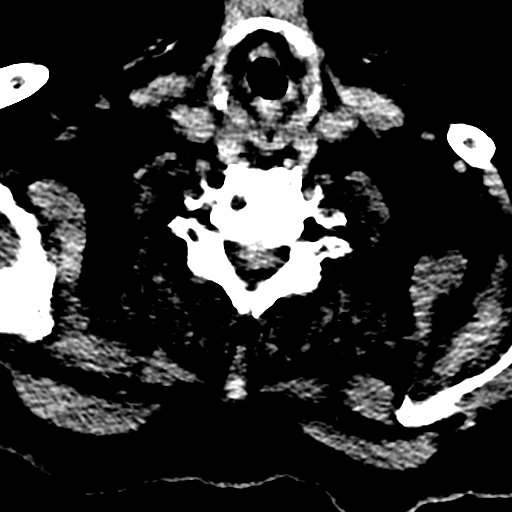
[im 22/71  brain]
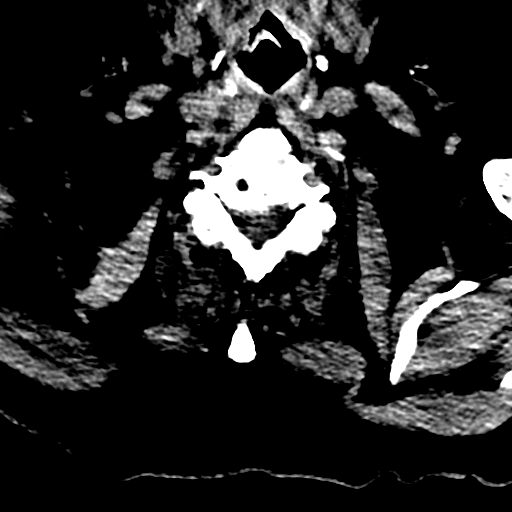

[15 of 47 positions shown; findings below may reference images not displayed]

FINDINGS: CT HEAD FINDINGS

Brain: No subdural, epidural, or subarachnoid hemorrhage. Prominence
of ventricles and sulci is stable. White matter changes are stable.
No acute cortical ischemia or infarct. Cerebellum and brainstem are
normal. Basal cisterns are widely patent. No mass, mass effect, or
midline shift.

Vascular: No hyperdense vessel or unexpected calcification.

Skull: There is an old fracture through the medial wall of the right
orbit. No acute fractures.

Sinuses/Orbits: There is debris in the sphenoid sinus of doubtful
acute significance. Paranasal sinuses are otherwise unremarkable.
The left mastoid air cells and bilateral middle ears are well
aerated. There is opacification of scattered right mastoid air
cells, stable in the interval of no acute significance.

Other: None.

CT CERVICAL SPINE FINDINGS

Alignment: Minimal anterolisthesis of C3 versus C4 is stable.
Minimal anterior listhesis of C4 versus C5 is also stable. No other
malalignment.

Skull base and vertebrae: No acute fractures.

Soft tissues and spinal canal: No prevertebral fluid or swelling. No
visible canal hematoma.

Disc levels: Multilevel degenerative changes identified including in
the facets.

Upper chest: Negative.

Other: No other abnormalities.
IMPRESSION: 1. No acute intracranial process.
2. No fracture or traumatic malalignment in the cervical spine.
